# Patient Record
Sex: Female | Born: 1965
Health system: Southern US, Community
[De-identification: ages and names within clinical notes are randomized; demographics above are authoritative.]

## PROBLEM LIST (undated history)

## (undated) DIAGNOSIS — E785 Hyperlipidemia, unspecified: Secondary | ICD-10-CM

## (undated) DIAGNOSIS — B019 Varicella without complication: Secondary | ICD-10-CM

## (undated) DIAGNOSIS — G43909 Migraine, unspecified, not intractable, without status migrainosus: Secondary | ICD-10-CM

## (undated) DIAGNOSIS — N390 Urinary tract infection, site not specified: Secondary | ICD-10-CM

## (undated) DIAGNOSIS — R51 Headache: Secondary | ICD-10-CM

## (undated) DIAGNOSIS — E119 Type 2 diabetes mellitus without complications: Secondary | ICD-10-CM

## (undated) DIAGNOSIS — E0590 Thyrotoxicosis, unspecified without thyrotoxic crisis or storm: Secondary | ICD-10-CM

## (undated) DIAGNOSIS — Z87440 Personal history of urinary (tract) infections: Secondary | ICD-10-CM

## (undated) DIAGNOSIS — M1990 Unspecified osteoarthritis, unspecified site: Secondary | ICD-10-CM

## (undated) DIAGNOSIS — F418 Other specified anxiety disorders: Secondary | ICD-10-CM

## (undated) DIAGNOSIS — E663 Overweight: Secondary | ICD-10-CM

## (undated) DIAGNOSIS — R519 Headache, unspecified: Secondary | ICD-10-CM

## (undated) DIAGNOSIS — B269 Mumps without complication: Secondary | ICD-10-CM

## (undated) HISTORY — DX: Hyperlipidemia, unspecified: E78.5

## (undated) HISTORY — DX: Personal history of urinary (tract) infections: Z87.440

## (undated) HISTORY — DX: Urinary tract infection, site not specified: N39.0

## (undated) HISTORY — DX: Varicella without complication: B01.9

## (undated) HISTORY — DX: Mumps without complication: B26.9

## (undated) HISTORY — DX: Other specified anxiety disorders: F41.8

## (undated) HISTORY — DX: Headache, unspecified: R51.9

## (undated) HISTORY — DX: Overweight: E66.3

## (undated) HISTORY — DX: Headache: R51

## (undated) HISTORY — DX: Unspecified osteoarthritis, unspecified site: M19.90

## (undated) HISTORY — DX: Migraine, unspecified, not intractable, without status migrainosus: G43.909

## (undated) HISTORY — DX: Type 2 diabetes mellitus without complications: E11.9

---
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
## 1991-01-18 HISTORY — PX: TUBAL LIGATION: SHX77

## 2009-04-17 LAB — HM PAP SMEAR: HM Pap smear: NORMAL

## 2012-09-28 ENCOUNTER — Ambulatory Visit: Payer: Self-pay | Admitting: Family Medicine

## 2012-11-01 ENCOUNTER — Ambulatory Visit (INDEPENDENT_AMBULATORY_CARE_PROVIDER_SITE_OTHER): Payer: Self-pay | Admitting: Family Medicine

## 2012-11-01 ENCOUNTER — Encounter: Payer: Self-pay | Admitting: Family Medicine

## 2012-11-01 VITALS — BP 94/68 | HR 63 | Temp 97.6°F | Ht 69.5 in | Wt 194.0 lb

## 2012-11-01 DIAGNOSIS — F341 Dysthymic disorder: Secondary | ICD-10-CM

## 2012-11-01 DIAGNOSIS — F329 Major depressive disorder, single episode, unspecified: Secondary | ICD-10-CM

## 2012-11-01 DIAGNOSIS — G43909 Migraine, unspecified, not intractable, without status migrainosus: Secondary | ICD-10-CM

## 2012-11-01 DIAGNOSIS — F418 Other specified anxiety disorders: Secondary | ICD-10-CM

## 2012-11-01 DIAGNOSIS — Z87440 Personal history of urinary (tract) infections: Secondary | ICD-10-CM | POA: Insufficient documentation

## 2012-11-01 DIAGNOSIS — E663 Overweight: Secondary | ICD-10-CM

## 2012-11-01 DIAGNOSIS — F32A Depression, unspecified: Secondary | ICD-10-CM

## 2012-11-01 DIAGNOSIS — N390 Urinary tract infection, site not specified: Secondary | ICD-10-CM

## 2012-11-01 DIAGNOSIS — F3289 Other specified depressive episodes: Secondary | ICD-10-CM

## 2012-11-01 DIAGNOSIS — R519 Headache, unspecified: Secondary | ICD-10-CM | POA: Insufficient documentation

## 2012-11-01 DIAGNOSIS — E119 Type 2 diabetes mellitus without complications: Secondary | ICD-10-CM

## 2012-11-01 DIAGNOSIS — Z0000 Encounter for general adult medical examination without abnormal findings: Secondary | ICD-10-CM

## 2012-11-01 DIAGNOSIS — M1990 Unspecified osteoarthritis, unspecified site: Secondary | ICD-10-CM

## 2012-11-01 DIAGNOSIS — M129 Arthropathy, unspecified: Secondary | ICD-10-CM

## 2012-11-01 DIAGNOSIS — R51 Headache: Secondary | ICD-10-CM

## 2012-11-01 DIAGNOSIS — E785 Hyperlipidemia, unspecified: Secondary | ICD-10-CM

## 2012-11-01 LAB — CBC
HCT: 36.2 % (ref 36.0–46.0)
MCH: 26.7 pg (ref 26.0–34.0)
MCV: 82.6 fL (ref 78.0–100.0)
Platelets: 339 10*3/uL (ref 150–400)
RBC: 4.38 MIL/uL (ref 3.87–5.11)
RDW: 13.3 % (ref 11.5–15.5)

## 2012-11-01 MED ORDER — ALPRAZOLAM 0.25 MG PO TABS: 0.2500 mg | ORAL_TABLET | Freq: Two times a day (BID) | ORAL | Status: DC | PRN

## 2012-11-01 MED ORDER — SUMATRIPTAN SUCCINATE 50 MG PO TABS: 50.0000 mg | ORAL_TABLET | ORAL | Status: DC | PRN

## 2012-11-01 MED ORDER — ESCITALOPRAM OXALATE 10 MG PO TABS: ORAL_TABLET | ORAL | Status: DC

## 2012-11-01 NOTE — Patient Instructions (Signed)
Switch to krill oil MegaRed caps 1 daily Ibuprofen 200 mg tabs 2 tabs po early in HA is best  Preventive Care for Adults, Female A healthy lifestyle and preventive care can promote health and wellness. Preventive health guidelines for women include the following key practices.  A routine yearly physical is a good way to check with your caregiver about your health and preventive screening. It is a chance to share any concerns and updates on your health, and to receive a thorough exam.  Visit your dentist for a routine exam and preventive care every 6 months. Brush your teeth twice a day and floss once a day. Good oral hygiene prevents tooth decay and gum disease.  The frequency of eye exams is based on your age, health, family medical history, use of contact lenses, and other factors. Follow your caregiver's recommendations for frequency of eye exams.  Eat a healthy diet. Foods like vegetables, fruits, whole grains, low-fat dairy products, and lean protein foods contain the nutrients you need without too many calories. Decrease your intake of foods high in solid fats, added sugars, and salt. Eat the right amount of calories for you.Get information about a proper diet from your caregiver, if necessary.  Regular physical exercise is one of the most important things you can do for your health. Most adults should get at least 150 minutes of moderate-intensity exercise (any activity that increases your heart rate and causes you to sweat) each week. In addition, most adults need muscle-strengthening exercises on 2 or more days a week.  Maintain a healthy weight. The body mass index (BMI) is a screening tool to identify possible weight problems. It provides an estimate of body fat based on height and weight. Your caregiver can help determine your BMI, and can help you achieve or maintain a healthy weight.For adults 20 years and older:  A BMI below 18.5 is considered underweight.  A BMI of 18.5 to 24.9  is normal.  A BMI of 25 to 29.9 is considered overweight.  A BMI of 30 and above is considered obese.  Maintain normal blood lipids and cholesterol levels by exercising and minimizing your intake of saturated fat. Eat a balanced diet with plenty of fruit and vegetables. Blood tests for lipids and cholesterol should begin at age 28 and be repeated every 5 years. If your lipid or cholesterol levels are high, you are over 50, or you are at high risk for heart disease, you may need your cholesterol levels checked more frequently.Ongoing high lipid and cholesterol levels should be treated with medicines if diet and exercise are not effective.  If you smoke, find out from your caregiver how to quit. If you do not use tobacco, do not start.  If you are pregnant, do not drink alcohol. If you are breastfeeding, be very cautious about drinking alcohol. If you are not pregnant and choose to drink alcohol, do not exceed 1 drink per day. One drink is considered to be 12 ounces (355 mL) of beer, 5 ounces (148 mL) of wine, or 1.5 ounces (44 mL) of liquor.  Avoid use of street drugs. Do not share needles with anyone. Ask for help if you need support or instructions about stopping the use of drugs.  High blood pressure causes heart disease and increases the risk of stroke. Your blood pressure should be checked at least every 1 to 2 years. Ongoing high blood pressure should be treated with medicines if weight loss and exercise are not effective.  If you are 82 to 49 years old, ask your caregiver if you should take aspirin to prevent strokes.  Diabetes screening involves taking a blood sample to check your fasting blood sugar level. This should be done once every 3 years, after age 66, if you are within normal weight and without risk factors for diabetes. Testing should be considered at a younger age or be carried out more frequently if you are overweight and have at least 1 risk factor for diabetes.  Breast  cancer screening is essential preventive care for women. You should practice "breast self-awareness." This means understanding the normal appearance and feel of your breasts and may include breast self-examination. Any changes detected, no matter how small, should be reported to a caregiver. Women in their 59s and 30s should have a clinical breast exam (CBE) by a caregiver as part of a regular health exam every 1 to 3 years. After age 53, women should have a CBE every year. Starting at age 52, women should consider having a mammography (breast X-ray test) every year. Women who have a family history of breast cancer should talk to their caregiver about genetic screening. Women at a high risk of breast cancer should talk to their caregivers about having magnetic resonance imaging (MRI) and a mammography every year.  The Pap test is a screening test for cervical cancer. A Pap test can show cell changes on the cervix that might become cervical cancer if left untreated. A Pap test is a procedure in which cells are obtained and examined from the lower end of the uterus (cervix).  Women should have a Pap test starting at age 33.  Between ages 30 and 51, Pap tests should be repeated every 2 years.  Beginning at age 51, you should have a Pap test every 3 years as long as the past 3 Pap tests have been normal.  Some women have medical problems that increase the chance of getting cervical cancer. Talk to your caregiver about these problems. It is especially important to talk to your caregiver if a new problem develops soon after your last Pap test. In these cases, your caregiver may recommend more frequent screening and Pap tests.  The above recommendations are the same for women who have or have not gotten the vaccine for human papillomavirus (HPV).  If you had a hysterectomy for a problem that was not cancer or a condition that could lead to cancer, then you no longer need Pap tests. Even if you no longer need  a Pap test, a regular exam is a good idea to make sure no other problems are starting.  If you are between ages 69 and 15, and you have had normal Pap tests going back 10 years, you no longer need Pap tests. Even if you no longer need a Pap test, a regular exam is a good idea to make sure no other problems are starting.  If you have had past treatment for cervical cancer or a condition that could lead to cancer, you need Pap tests and screening for cancer for at least 20 years after your treatment.  If Pap tests have been discontinued, risk factors (such as a new sexual partner) need to be reassessed to determine if screening should be resumed.  The HPV test is an additional test that may be used for cervical cancer screening. The HPV test looks for the virus that can cause the cell changes on the cervix. The cells collected during the Pap test  can be tested for HPV. The HPV test could be used to screen women aged 89 years and older, and should be used in women of any age who have unclear Pap test results. After the age of 53, women should have HPV testing at the same frequency as a Pap test.  Colorectal cancer can be detected and often prevented. Most routine colorectal cancer screening begins at the age of 54 and continues through age 3. However, your caregiver may recommend screening at an earlier age if you have risk factors for colon cancer. On a yearly basis, your caregiver may provide home test kits to check for hidden blood in the stool. Use of a small camera at the end of a tube, to directly examine the colon (sigmoidoscopy or colonoscopy), can detect the earliest forms of colorectal cancer. Talk to your caregiver about this at age 12, when routine screening begins. Direct examination of the colon should be repeated every 5 to 10 years through age 26, unless early forms of pre-cancerous polyps or small growths are found.  Hepatitis C blood testing is recommended for all people born from [birth year]  through 1965 and any individual with known risks for hepatitis C.  Practice safe sex. Use condoms and avoid high-risk sexual practices to reduce the spread of sexually transmitted infections (STIs). STIs include gonorrhea, chlamydia, syphilis, trichomonas, herpes, HPV, and human immunodeficiency virus (HIV). Herpes, HIV, and HPV are viral illnesses that have no cure. They can result in disability, cancer, and death. Sexually active women aged 27 and younger should be checked for chlamydia. Older women with new or multiple partners should also be tested for chlamydia. Testing for other STIs is recommended if you are sexually active and at increased risk.  Osteoporosis is a disease in which the bones lose minerals and strength with aging. This can result in serious bone fractures. The risk of osteoporosis can be identified using a bone density scan. Women ages 17 and over and women at risk for fractures or osteoporosis should discuss screening with their caregivers. Ask your caregiver whether you should take a calcium supplement or vitamin D to reduce the rate of osteoporosis.  Menopause can be associated with physical symptoms and risks. Hormone replacement therapy is available to decrease symptoms and risks. You should talk to your caregiver about whether hormone replacement therapy is right for you.  Use sunscreen with sun protection factor (SPF) of 30 or more. Apply sunscreen liberally and repeatedly throughout the day. You should seek shade when your shadow is shorter than you. Protect yourself by wearing long sleeves, pants, a wide-brimmed hat, and sunglasses year round, whenever you are outdoors.  Once a month, do a whole body skin exam, using a mirror to look at the skin on your back. Notify your caregiver of new moles, moles that have irregular borders, moles that are larger than a pencil eraser, or moles that have changed in shape or color.  Stay current with required immunizations.  Influenza.  You need a dose every fall (or winter). The composition of the flu vaccine changes each year, so being vaccinated once is not enough.  Pneumococcal polysaccharide. You need 1 to 2 doses if you smoke cigarettes or if you have certain chronic medical conditions. You need 1 dose at age 65 (or older) if you have never been vaccinated.  Tetanus, diphtheria, pertussis (Tdap, Td). Get 1 dose of Tdap vaccine if you are younger than age 33, are over 81 and have contact with an infant,  are a Research scientist (physical sciences), are pregnant, or simply want to be protected from whooping cough. After that, you need a Td booster dose every 10 years. Consult your caregiver if you have not had at least 3 tetanus and diphtheria-containing shots sometime in your life or have a deep or dirty wound.  HPV. You need this vaccine if you are a woman age 50 or younger. The vaccine is given in 3 doses over 6 months.  Measles, mumps, rubella (MMR). You need at least 1 dose of MMR if you were born in 1957 or later. You may also need a second dose.  Meningococcal. If you are age 67 to 75 and a first-year college student living in a residence hall, or have one of several medical conditions, you need to get vaccinated against meningococcal disease. You may also need additional booster doses.  Zoster (shingles). If you are age 72 or older, you should get this vaccine.  Varicella (chickenpox). If you have never had chickenpox or you were vaccinated but received only 1 dose, talk to your caregiver to find out if you need this vaccine.  Hepatitis A. You need this vaccine if you have a specific risk factor for hepatitis A virus infection or you simply wish to be protected from this disease. The vaccine is usually given as 2 doses, 6 to 18 months apart.  Hepatitis B. You need this vaccine if you have a specific risk factor for hepatitis B virus infection or you simply wish to be protected from this disease. The vaccine is given in 3 doses, usually  over 6 months. Preventive Services / Frequency Ages 30 to 37  Blood pressure check.** / Every 1 to 2 years.  Lipid and cholesterol check.** / Every 5 years beginning at age 54.  Clinical breast exam.** / Every 3 years for women in their 61s and 30s.  Pap test.** / Every 2 years from ages 37 through 81. Every 3 years starting at age 4 through age 37 or 62 with a history of 3 consecutive normal Pap tests.  HPV screening.** / Every 3 years from ages 25 through ages 61 to 59 with a history of 3 consecutive normal Pap tests.  Hepatitis C blood test.** / For any individual with known risks for hepatitis C.  Skin self-exam. / Monthly.  Influenza immunization.** / Every year.  Pneumococcal polysaccharide immunization.** / 1 to 2 doses if you smoke cigarettes or if you have certain chronic medical conditions.  Tetanus, diphtheria, pertussis (Tdap, Td) immunization. / A one-time dose of Tdap vaccine. After that, you need a Td booster dose every 10 years.  HPV immunization. / 3 doses over 6 months, if you are 86 and younger.  Measles, mumps, rubella (MMR) immunization. / You need at least 1 dose of MMR if you were born in 1957 or later. You may also need a second dose.  Meningococcal immunization. / 1 dose if you are age 38 to 90 and a first-year college student living in a residence hall, or have one of several medical conditions, you need to get vaccinated against meningococcal disease. You may also need additional booster doses.  Varicella immunization.** / Consult your caregiver.  Hepatitis A immunization.** / Consult your caregiver. 2 doses, 6 to 18 months apart.  Hepatitis B immunization.** / Consult your caregiver. 3 doses usually over 6 months. Ages 46 to 23  Blood pressure check.** / Every 1 to 2 years.  Lipid and cholesterol check.** / Every 5 years beginning at  age 1.  Clinical breast exam.** / Every year after age 16.  Mammogram.** / Every year beginning at age 34 and  continuing for as long as you are in good health. Consult with your caregiver.  Pap test.** / Every 3 years starting at age 49 through age 2 or 81 with a history of 3 consecutive normal Pap tests.  HPV screening.** / Every 3 years from ages 31 through ages 69 to 65 with a history of 3 consecutive normal Pap tests.  Fecal occult blood test (FOBT) of stool. / Every year beginning at age 9 and continuing until age 70. You may not need to do this test if you get a colonoscopy every 10 years.  Flexible sigmoidoscopy or colonoscopy.** / Every 5 years for a flexible sigmoidoscopy or every 10 years for a colonoscopy beginning at age 49 and continuing until age 22.  Hepatitis C blood test.** / For all people born from [birth year] through 1967 and any individual with known risks for hepatitis C.  Skin self-exam. / Monthly.  Influenza immunization.** / Every year.  Pneumococcal polysaccharide immunization.** / 1 to 2 doses if you smoke cigarettes or if you have certain chronic medical conditions.  Tetanus, diphtheria, pertussis (Tdap, Td) immunization.** / A one-time dose of Tdap vaccine. After that, you need a Td booster dose every 10 years.  Measles, mumps, rubella (MMR) immunization. / You need at least 1 dose of MMR if you were born in 1957 or later. You may also need a second dose.  Varicella immunization.** / Consult your caregiver.  Meningococcal immunization.** / Consult your caregiver.  Hepatitis A immunization.** / Consult your caregiver. 2 doses, 6 to 18 months apart.  Hepatitis B immunization.** / Consult your caregiver. 3 doses, usually over 6 months. Ages 54 and over  Blood pressure check.** / Every 1 to 2 years.  Lipid and cholesterol check.** / Every 5 years beginning at age 34.  Clinical breast exam.** / Every year after age 32.  Mammogram.** / Every year beginning at age 43 and continuing for as long as you are in good health. Consult with your caregiver.  Pap test.** / Every  3 years starting at age 50 through age 35 or 76 with a 3 consecutive normal Pap tests. Testing can be stopped between 65 and 70 with 3 consecutive normal Pap tests and no abnormal Pap or HPV tests in the past 10 years.  HPV screening.** / Every 3 years from ages 16 through ages 31 or 74 with a history of 3 consecutive normal Pap tests. Testing can be stopped between 65 and 70 with 3 consecutive normal Pap tests and no abnormal Pap or HPV tests in the past 10 years.  Fecal occult blood test (FOBT) of stool. / Every year beginning at age 42 and continuing until age 10. You may not need to do this test if you get a colonoscopy every 10 years.  Flexible sigmoidoscopy or colonoscopy.** / Every 5 years for a flexible sigmoidoscopy or every 10 years for a colonoscopy beginning at age 88 and continuing until age 91.  Hepatitis C blood test.** / For all people born from [birth year] through 1965 and any individual with known risks for hepatitis C.  Osteoporosis screening.** / A one-time screening for women ages 55 and over and women at risk for fractures or osteoporosis.  Skin self-exam. / Monthly.  Influenza immunization.** / Every year.  Pneumococcal polysaccharide immunization.** / 1 dose at age 45 (or older) if you have  never been vaccinated.  Tetanus, diphtheria, pertussis (Tdap, Td) immunization. / A one-time dose of Tdap vaccine if you are over 65 and have contact with an infant, are a Research scientist (physical sciences), or simply want to be protected from whooping cough. After that, you need a Td booster dose every 10 years.  Varicella immunization.** / Consult your caregiver.  Meningococcal immunization.** / Consult your caregiver.  Hepatitis A immunization.** / Consult your caregiver. 2 doses, 6 to 18 months apart.  Hepatitis B immunization.** / Check with your caregiver. 3 doses, usually over 6 months. ** Family history and personal history of risk and conditions may change your caregiver's  recommendations. Document Released: 03/01/2001 Document Revised: 03/28/2011 Document Reviewed: 05/31/2010 [facility] Patient Information 2014 [place], [place].

## 2012-11-02 ENCOUNTER — Telehealth: Payer: Self-pay

## 2012-11-02 LAB — RENAL FUNCTION PANEL
Albumin: 4.1 g/dL (ref 3.5–5.2)
BUN: 15 mg/dL (ref 6–23)
CO2: 29 mEq/L (ref 19–32)
Chloride: 103 mEq/L (ref 96–112)
Creat: 0.8 mg/dL (ref 0.50–1.10)
Glucose, Bld: 182 mg/dL — ABNORMAL HIGH (ref 70–99)

## 2012-11-02 LAB — HEPATIC FUNCTION PANEL
AST: 14 U/L (ref 0–37)
Albumin: 4.1 g/dL (ref 3.5–5.2)
Alkaline Phosphatase: 73 U/L (ref 39–117)
Bilirubin, Direct: 0.1 mg/dL (ref 0.0–0.3)
Total Bilirubin: 0.4 mg/dL (ref 0.3–1.2)
Total Protein: 7.8 g/dL (ref 6.0–8.3)

## 2012-11-02 LAB — LIPID PANEL
Cholesterol: 230 mg/dL — ABNORMAL HIGH (ref 0–200)
LDL Cholesterol: 154 mg/dL — ABNORMAL HIGH (ref 0–99)
Total CHOL/HDL Ratio: 4.3 Ratio
VLDL: 22 mg/dL (ref 0–40)

## 2012-11-02 LAB — TSH: TSH: 1.168 u[IU]/mL (ref 0.350–4.500)

## 2012-11-02 NOTE — Telephone Encounter (Signed)
Notified pt's mother and she will relay message to pt.

## 2012-11-02 NOTE — Telephone Encounter (Signed)
So I gave her 3 months of Lexapro yesterday and 20 Alprazolam and 10 Imitrex she should not need anything else over the weekend

## 2012-11-02 NOTE — Telephone Encounter (Signed)
Pt states she was seen in the office yesterday and didn't want to get her refills at that time. She has changed her mind and decided she wants to pick them up Sunday. She is requesting refills on her Xanax, Lexapro, and Imitrex to be sent to [facility] in [place]. Please advise.

## 2012-11-03 ENCOUNTER — Encounter: Payer: Self-pay | Admitting: Family Medicine

## 2012-11-03 DIAGNOSIS — M1990 Unspecified osteoarthritis, unspecified site: Secondary | ICD-10-CM | POA: Insufficient documentation

## 2012-11-03 DIAGNOSIS — F418 Other specified anxiety disorders: Secondary | ICD-10-CM

## 2012-11-03 HISTORY — DX: Other specified anxiety disorders: F41.8

## 2012-11-03 NOTE — Assessment & Plan Note (Signed)
Encouraged HA hygiene and given rx for Imitrex may use Excedrine Migraine or Ibuprofen

## 2012-11-03 NOTE — Assessment & Plan Note (Signed)
Poor control, start on Metformin, avoid simple carbs and return for further counseling.

## 2012-11-03 NOTE — Assessment & Plan Note (Signed)
Started on Lexapro and Alprazolam prn

## 2012-11-03 NOTE — Progress Notes (Signed)
Patient ID: Sarah Ibarra, female   DOB: 31-Jul-1965, 47 y.o.   MRN: [id number] [facility] [id number] 24-Jun-1965 11/03/2012      Progress Note-Follow Up  Subjective  Chief Complaint  Chief Complaint  Patient presents with  . Establish Care    new patient    HPI  Patient is a 47 year old African American female who is in today to establish care. Struggling with perimenopausal hot flashes. They occur both day and night as frequently as every 1-2 hours. She's also having frequent headaches often with photophobia and some nausea. He's experiencing some polyuria and polydipsia and has been told she has diabetes in the past. No nausea vomiting. Tylenol and ibuprofen have been mildly helpful for headaches. Has chronic low back pain and hip pain as well. No chest pain, palpitations, shortness of breath.  Past Medical History  Diagnosis Date  . Chicken pox as a child  . Mumps as a child  . Frequent headaches   . Migraines   . UTI (urinary tract infection)   . Hyperlipidemia   . Diabetes mellitus without complication   . Arthritis     low back pain  . Overweight   . Hx: UTI (urinary tract infection)   . Depression with anxiety 11/03/2012    Past Surgical History  Procedure Laterality Date  . Tubal ligation  1993  . Cesarean section  1993    Family History  Problem Relation Age of Onset  . Other Mother     low blood pressure  . Hyperlipidemia Mother   . Multiple sclerosis Son   . Stroke Maternal Grandmother   . Diabetes Maternal Grandmother     type 2  . Alzheimer's disease Paternal Grandfather     History   Social History  . Marital Status: Single    Spouse Name: N/A    Number of Children: N/A  . Years of Education: N/A   Occupational History  . Not on file.   Social History Main Topics  . Smoking status: Never Smoker   . Smokeless tobacco: Never Used  . Alcohol Use: No  . Drug Use: No  . Sexual Activity: Yes    Partners: Male     Comment: lives with  mother   Other Topics Concern  . Not on file   Social History Narrative  . No narrative on file    No current outpatient prescriptions on file prior to visit.   No current facility-administered medications on file prior to visit.    Allergies  Allergen Reactions  . Morphine And Related     hives    Review of Systems  Review of Systems  Constitutional: Positive for malaise/fatigue. Negative for fever and chills.  HENT: Negative for congestion, hearing loss and nosebleeds.   Eyes: Negative for discharge.  Respiratory: Negative for cough, sputum production, shortness of breath and wheezing.   Cardiovascular: Negative for chest pain, palpitations and leg swelling.  Gastrointestinal: Negative for heartburn, nausea, vomiting, abdominal pain, diarrhea, constipation and blood in stool.  Genitourinary: Negative for dysuria, urgency, frequency and hematuria.  Musculoskeletal: Negative for back pain, falls and myalgias.  Skin: Negative for rash.  Neurological: Positive for headaches. Negative for dizziness, tremors, sensory change, focal weakness, loss of consciousness and weakness.  Endo/Heme/Allergies: Negative for polydipsia. Does not bruise/bleed easily.  Psychiatric/Behavioral: Positive for depression. Negative for suicidal ideas. The patient is nervous/anxious and has insomnia.     Objective  BP 94/68  Pulse 63  Temp(Src) 97.6 F (36.4  C) (Oral)  Ht 5' 9.5" (1.765 m)  Wt 194 lb 0.6 oz (88.016 kg)  BMI 28.25 kg/m2  SpO2 97%  LMP 04/06/2012  Physical Exam  Physical Exam  Constitutional: She is oriented to person, place, and time and well-developed, well-nourished, and in no distress. No distress.  HENT:  Head: Normocephalic and atraumatic.  Right Ear: External ear normal.  Left Ear: External ear normal.  Nose: Nose normal.  Mouth/Throat: Oropharynx is clear and moist. No oropharyngeal exudate.  Eyes: Conjunctivae are normal. Pupils are equal, round, and reactive to  light. Right eye exhibits no discharge. Left eye exhibits no discharge. No scleral icterus.  Neck: Normal range of motion. Neck supple. No thyromegaly present.  Cardiovascular: Normal rate, regular rhythm, normal heart sounds and intact distal pulses.   No murmur heard. Pulmonary/Chest: Effort normal and breath sounds normal. No respiratory distress. She has no wheezes. She has no rales.  Abdominal: Soft. Bowel sounds are normal. She exhibits no distension and no mass. There is no tenderness.  Musculoskeletal: Normal range of motion. She exhibits no edema and no tenderness.  Lymphadenopathy:    She has no cervical adenopathy.  Neurological: She is alert and oriented to person, place, and time. She has normal reflexes. No cranial nerve deficit. Coordination normal.  Skin: Skin is warm and dry. No rash noted. She is not diaphoretic.  Psychiatric: Mood, memory and affect normal.    Lab Results  Component Value Date   TSH 1.168 11/01/2012   Lab Results  Component Value Date   WBC 5.6 11/01/2012   HGB 11.7* 11/01/2012   HCT 36.2 11/01/2012   MCV 82.6 11/01/2012   PLT 339 11/01/2012   Lab Results  Component Value Date   CREATININE 0.80 11/01/2012   BUN 15 11/01/2012   NA 141 11/01/2012   K 4.5 11/01/2012   CL 103 11/01/2012   CO2 29 11/01/2012   Lab Results  Component Value Date   ALT 10 11/01/2012   AST 14 11/01/2012   ALKPHOS 73 11/01/2012   BILITOT 0.4 11/01/2012   Lab Results  Component Value Date   CHOL 230* 11/01/2012   Lab Results  Component Value Date   HDL 54 11/01/2012   Lab Results  Component Value Date   LDLCALC 154* 11/01/2012   Lab Results  Component Value Date   TRIG 111 11/01/2012   Lab Results  Component Value Date   CHOLHDL 4.3 11/01/2012     Assessment & Plan  Migraines Encouraged HA hygiene and given rx for Imitrex may use Excedrine Migraine or Ibuprofen  Diabetes mellitus without complication Poor control, start on Metformin, avoid  simple carbs and return for further counseling.   Hyperlipidemia Avoid trans fats, increase exercise. Will benefit from low dose statin.  Overweight Encouraged DASH diet and increased exercise  Depression with anxiety Started on Lexapro and Alprazolam prn

## 2012-11-03 NOTE — Assessment & Plan Note (Signed)
Avoid trans fats, increase exercise. Will benefit from low dose statin.

## 2012-11-03 NOTE — Assessment & Plan Note (Signed)
Encouraged DASH diet and increased exercise 

## 2012-11-19 ENCOUNTER — Telehealth: Payer: Self-pay | Admitting: Family Medicine

## 2012-11-19 NOTE — Telephone Encounter (Signed)
Received medical records from [facility]  P: [phone] F: [phone]

## 2012-11-21 ENCOUNTER — Other Ambulatory Visit: Payer: Self-pay

## 2012-11-21 ENCOUNTER — Telehealth: Payer: Self-pay

## 2012-11-21 MED ORDER — ATORVASTATIN CALCIUM 10 MG PO TABS: 10.0000 mg | ORAL_TABLET | Freq: Every day | ORAL | Status: DC

## 2012-11-21 MED ORDER — METFORMIN HCL 500 MG PO TABS: 500.0000 mg | ORAL_TABLET | Freq: Two times a day (BID) | ORAL | Status: DC

## 2012-11-21 NOTE — Telephone Encounter (Signed)
Message copied by Eulis Manly on Wed Nov 21, 2012  2:17 PM ------      Message from: Danise Edge A      Created: Fri Nov 02, 2012  7:50 AM       Notify hgba1c is 9.6 she needs to go on sugar meds and her cholesterol is up, she should start Metformin 500 mg po bid, disp #60, no rf and Atorvastatin 10 mg po qhs, dips #30 no rf and she should come back in in next couple weeks for discussion and to get glucometer, education and possibly referral to nutritionist ------

## 2012-11-21 NOTE — Telephone Encounter (Signed)
Called Patient and informed her that her metformin and Atorvastatin were ready for pick up.

## 2012-11-22 ENCOUNTER — Other Ambulatory Visit: Payer: Self-pay

## 2013-09-16 ENCOUNTER — Telehealth: Payer: Self-pay

## 2013-09-16 NOTE — Telephone Encounter (Signed)
LVM for pt to call back and schedule CPE with PCP.   Diabetic bundle pt.  

## 2013-10-24 LAB — HM DIABETES EYE EXAM

## 2013-11-19 ENCOUNTER — Encounter: Payer: Self-pay | Admitting: Family Medicine

## 2017-04-11 ENCOUNTER — Ambulatory Visit: Payer: Self-pay | Admitting: Family

## 2017-05-30 ENCOUNTER — Ambulatory Visit (INDEPENDENT_AMBULATORY_CARE_PROVIDER_SITE_OTHER): Payer: 59 | Admitting: Family

## 2017-05-30 ENCOUNTER — Encounter: Payer: Self-pay | Admitting: Family

## 2017-05-30 VITALS — BP 117/69 | HR 69 | Temp 98.3°F | Resp 16 | Ht 69.5 in | Wt 172.4 lb

## 2017-05-30 DIAGNOSIS — F418 Other specified anxiety disorders: Secondary | ICD-10-CM

## 2017-05-30 DIAGNOSIS — E1165 Type 2 diabetes mellitus with hyperglycemia: Secondary | ICD-10-CM | POA: Diagnosis not present

## 2017-05-30 DIAGNOSIS — E119 Type 2 diabetes mellitus without complications: Secondary | ICD-10-CM

## 2017-05-30 DIAGNOSIS — G43909 Migraine, unspecified, not intractable, without status migrainosus: Secondary | ICD-10-CM

## 2017-05-30 DIAGNOSIS — Z23 Encounter for immunization: Secondary | ICD-10-CM | POA: Diagnosis not present

## 2017-05-30 DIAGNOSIS — R5383 Other fatigue: Secondary | ICD-10-CM

## 2017-05-30 LAB — HEPATIC FUNCTION PANEL
ALT: 8 U/L (ref 0–35)
AST: 10 U/L (ref 0–37)
Albumin: 3.9 g/dL (ref 3.5–5.2)
Alkaline Phosphatase: 59 U/L (ref 39–117)
Bilirubin, Direct: 0.1 mg/dL (ref 0.0–0.3)
Total Bilirubin: 0.4 mg/dL (ref 0.2–1.2)
Total Protein: 7.5 g/dL (ref 6.0–8.3)

## 2017-05-30 LAB — CBC WITH DIFFERENTIAL/PLATELET
BASOS ABS: 0.2 10*3/uL — AB (ref 0.0–0.1)
BASOS PCT: 4.2 % — AB (ref 0.0–3.0)
Eosinophils Absolute: 0.1 10*3/uL (ref 0.0–0.7)
Eosinophils Relative: 2.5 % (ref 0.0–5.0)
HCT: 35.5 % — ABNORMAL LOW (ref 36.0–46.0)
Hemoglobin: 11.7 g/dL — ABNORMAL LOW (ref 12.0–15.0)
Lymphocytes Relative: 40.6 % (ref 12.0–46.0)
Lymphs Abs: 2.1 10*3/uL (ref 0.7–4.0)
MCHC: 32.9 g/dL (ref 30.0–36.0)
MCV: 84.7 fl (ref 78.0–100.0)
MONOS PCT: 11.3 % (ref 3.0–12.0)
Monocytes Absolute: 0.6 10*3/uL (ref 0.1–1.0)
NEUTROS ABS: 2.1 10*3/uL (ref 1.4–7.7)
NEUTROS PCT: 41.4 % — AB (ref 43.0–77.0)
PLATELETS: 322 10*3/uL (ref 150.0–400.0)
RBC: 4.2 Mil/uL (ref 3.87–5.11)
RDW: 12.9 % (ref 11.5–15.5)
WBC: 5.1 10*3/uL (ref 4.0–10.5)

## 2017-05-30 LAB — HEMOGLOBIN A1C: HEMOGLOBIN A1C: 8.3 % — AB (ref 4.6–6.5)

## 2017-05-30 LAB — BASIC METABOLIC PANEL
BUN: 17 mg/dL (ref 6–23)
CALCIUM: 9.5 mg/dL (ref 8.4–10.5)
CHLORIDE: 105 meq/L (ref 96–112)
CO2: 30 meq/L (ref 19–32)
Creatinine, Ser: 0.76 mg/dL (ref 0.40–1.20)
GFR: 101.93 mL/min (ref >60.00)
Glucose, Bld: 180 mg/dL — ABNORMAL HIGH (ref 70–99)
Potassium: 4.4 mEq/L (ref 3.5–5.1)
Sodium: 142 mEq/L (ref 135–145)

## 2017-05-30 LAB — LIPID PANEL
CHOLESTEROL: 209 mg/dL — AB (ref 0–200)
HDL: 57.5 mg/dL (ref >39.00)
LDL Cholesterol: 137 mg/dL — ABNORMAL HIGH (ref 0–99)
NonHDL: 151.65
TRIGLYCERIDES: 72 mg/dL (ref 0.0–149.0)
Total CHOL/HDL Ratio: 4
VLDL: 14.4 mg/dL (ref 0.0–40.0)

## 2017-05-30 LAB — TSH: TSH: 0.03 u[IU]/mL — ABNORMAL LOW (ref 0.35–4.50)

## 2017-05-30 NOTE — Assessment & Plan Note (Signed)
Currently stable. Monitor.  

## 2017-05-30 NOTE — Progress Notes (Signed)
Subjective:    Patient ID: Sarah Ibarra, female    DOB: 1965/04/27, 52 y.o.   MRN: [id number]  HPI  Patient is a 52 yr old female who presents today to establish care.  DM2- Reports that this was diagnosed in 2014.  Reports that her diet is a "roller coaster." Off meds since 2014. Now has insurance.   Lab Results  Component Value Date   HGBA1C 9.6 (H) 11/01/2012   Lab Results  Component Value Date   LDLCALC 154 (H) 11/01/2012   CREATININE 0.80 11/01/2012   Migraines- have improved with menopause.  Usually resolves with ibuprofen.    Depression/anxiety-Reports that "it hits hard sometimes."  Sleeps well.   Fatigue- c/o recent fatigue.    Review of Systems  Constitutional: Negative for unexpected weight change.  HENT: Negative for rhinorrhea.   Respiratory: Negative for cough.   Cardiovascular: Negative for leg swelling.  Gastrointestinal: Negative for constipation and diarrhea.  Genitourinary: Positive for frequency. Negative for dysuria.  Musculoskeletal: Negative for arthralgias and myalgias.  Skin: Negative for rash.  Hematological: Negative for adenopathy.  Psychiatric/Behavioral:       Recent breakup, grieving   Past Medical History:  Diagnosis Date  . Arthritis    low back pain  . Chicken pox as a child  . Depression with anxiety 11/03/2012  . Diabetes mellitus without complication (HCC)   . Frequent headaches   . Hx: UTI (urinary tract infection)   . Hyperlipidemia   . Migraines   . Mumps as a child  . Overweight   . UTI (urinary tract infection)      Social History   Socioeconomic History  . Marital status: Single    Spouse name: Not on file  . Number of children: 3  . Years of education: Not on file  . Highest education level: Not on file  Occupational History  . 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 Financial resource strain: Not hard at all  . Food insecurity:    Worry: Never true    Inability: Never true  .  Transportation needs:    Medical: No    Non-medical: No  Tobacco Use  . Smoking status: Never Smoker  . Smokeless tobacco: Never Used  Substance and Sexual Activity  . Alcohol use: No  . Drug use: No  . Sexual activity: Not Currently    Partners: Male    Comment: lives with mother  Lifestyle  . Physical activity:    Days per week: 3 days    Minutes per session: 60 min  . Stress: Not at all  Relationships  . Social connections:    Talks on phone: More than three times a week    Gets together: More than three times a week    Attends religious service: More than 4 times per year    Active member of club or organization: No    Attends meetings of clubs or organizations: Never    Relationship status: Not on file  . Intimate partner violence:    Fear of current or ex partner: No    Emotionally abused: No    Physically abused: No    Forced sexual activity: No  Other Topics Concern  . 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 CESAREAN SECTION  1993  . TUBAL LIGATION  1993    Family History  Problem Relation Age of Onset  . Other Mother        low blood pressure  . Hyperlipidemia Mother   . Multiple sclerosis Son   . Stroke Maternal Grandmother   . Diabetes Maternal Grandmother        type 2  . Alzheimer's disease Paternal Grandfather     Allergies  Allergen Reactions  . Morphine And Related     hives    No current outpatient medications on file prior to visit.   No current facility-administered medications on file prior to visit.     BP 117/69 (BP Location: Right Arm, Patient Position: Sitting, Cuff Size: Small)   Pulse 69   Temp 98.3 F (36.8 C) (Oral)   Resp 16   Ht 5' 9.5" (1.765 m)   Wt 172  lb 6.4 oz (78.2 kg)   SpO2 100%   BMI 25.09 kg/m        Objective:   Physical Exam  Constitutional: She is oriented to person, place, and time. She appears well-developed and well-nourished.  HENT:  Head: Normocephalic and atraumatic.  Eyes: Right eye exhibits no discharge. Left eye exhibits no discharge.  Neck: Neck supple. No thyromegaly present.  Cardiovascular: Normal rate, regular rhythm and normal heart sounds.  No murmur heard. Pulmonary/Chest: Effort normal and breath sounds normal. No respiratory distress. She has no wheezes.  Musculoskeletal: She exhibits no edema.  Lymphadenopathy:    She has no cervical adenopathy.  Neurological: She is alert and oriented to person, place, and time.  Skin: Skin is warm and dry.  Psychiatric: She has a normal mood and affect. Her behavior is normal. Judgment and thought content normal.          Assessment & Plan:  Fatigue- new. Check cbc and tsh to further evaluate.

## 2017-05-30 NOTE — Patient Instructions (Signed)
Please complete lab work prior to leaving.   

## 2017-05-30 NOTE — Assessment & Plan Note (Signed)
Stable/improved following menopause. Monitor.

## 2017-05-30 NOTE — Assessment & Plan Note (Signed)
Clinically uncontrolled. Obtain A1C, urine microalbumin. Further recommendations following review of results.  Pneumovax today.

## 2017-05-31 ENCOUNTER — Telehealth: Payer: Self-pay | Admitting: Family

## 2017-05-31 LAB — MICROALBUMIN / CREATININE URINE RATIO
CREATININE, U: 184.2 mg/dL
Microalb Creat Ratio: 0.5 mg/g (ref 0.0–30.0)
Microalb, Ur: 0.9 mg/dL (ref 0.0–1.9)

## 2017-05-31 MED ORDER — ATORVASTATIN CALCIUM 10 MG PO TABS: 10.0000 mg | ORAL_TABLET | Freq: Every day | ORAL | 3 refills | Status: DC

## 2017-05-31 MED ORDER — METFORMIN HCL 500 MG PO TABS: 500.0000 mg | ORAL_TABLET | Freq: Two times a day (BID) | ORAL | 3 refills | Status: DC

## 2017-05-31 NOTE — Telephone Encounter (Signed)
Lab work shows overactive thyroid.  Please ask lab to add on free t3 and free t4.  Also, sugar is elevated.  Begin metformin bid along with diabetic diet/exercise. Cholesterol above goal. Add atorvastatin  once daily. Mild anemia. Please ask lab to add on serum iron and ferritin dx anemia, and ask pt to complete IFOB.

## 2017-05-31 NOTE — Addendum Note (Signed)
Addended by: Sandford Craze on: 05/31/2017 10:19 AM   Modules accepted: Level of Service

## 2017-06-01 ENCOUNTER — Other Ambulatory Visit: Payer: Self-pay

## 2017-06-01 ENCOUNTER — Other Ambulatory Visit (INDEPENDENT_AMBULATORY_CARE_PROVIDER_SITE_OTHER): Payer: 59

## 2017-06-01 DIAGNOSIS — D649 Anemia, unspecified: Secondary | ICD-10-CM

## 2017-06-01 DIAGNOSIS — E0590 Thyrotoxicosis, unspecified without thyrotoxic crisis or storm: Secondary | ICD-10-CM

## 2017-06-01 LAB — T4, FREE: Free T4: 1.09 ng/dL (ref 0.60–1.60)

## 2017-06-01 LAB — FERRITIN: Ferritin: 74.4 ng/mL (ref 10.0–291.0)

## 2017-06-01 LAB — IRON: IRON: 61 ug/dL (ref 42–145)

## 2017-06-01 LAB — T3, FREE: T3, Free: 3.9 pg/mL (ref 2.3–4.2)

## 2017-06-01 NOTE — Telephone Encounter (Signed)
Results given to patient in detail, she understands results and will start medications. Form fax to [facility] to add test. IFOB will be mailed out to patient, future order entered.

## 2017-06-02 ENCOUNTER — Other Ambulatory Visit: Payer: Self-pay

## 2017-06-02 ENCOUNTER — Telehealth: Payer: Self-pay | Admitting: Family

## 2017-06-02 DIAGNOSIS — E0590 Thyrotoxicosis, unspecified without thyrotoxic crisis or storm: Secondary | ICD-10-CM

## 2017-06-02 NOTE — Telephone Encounter (Signed)
Patient advised of results, IFOB was mailed out to patient yesterday. Orders enter to repeat TSH, T3 and T4 in one month.

## 2017-06-02 NOTE — Telephone Encounter (Signed)
Iron level is low. One of her thyroid tests shows mild overactive thyroid. Please complete IFOB and repeat TSH, free T3/free T4 in f1 month.

## 2017-06-07 DIAGNOSIS — E119 Type 2 diabetes mellitus without complications: Secondary | ICD-10-CM | POA: Diagnosis not present

## 2017-06-07 DIAGNOSIS — H2513 Age-related nuclear cataract, bilateral: Secondary | ICD-10-CM | POA: Diagnosis not present

## 2017-06-07 LAB — HM DIABETES EYE EXAM

## 2017-06-09 ENCOUNTER — Other Ambulatory Visit (INDEPENDENT_AMBULATORY_CARE_PROVIDER_SITE_OTHER): Payer: 59

## 2017-06-09 DIAGNOSIS — D649 Anemia, unspecified: Secondary | ICD-10-CM | POA: Diagnosis not present

## 2017-06-09 LAB — FECAL OCCULT BLOOD, IMMUNOCHEMICAL: FECAL OCCULT BLD: NEGATIVE

## 2017-06-19 ENCOUNTER — Encounter: Payer: Self-pay | Admitting: Family

## 2018-09-07 LAB — HM DIABETES EYE EXAM

## 2018-10-05 ENCOUNTER — Ambulatory Visit (INDEPENDENT_AMBULATORY_CARE_PROVIDER_SITE_OTHER): Payer: 59 | Admitting: Family

## 2018-10-05 ENCOUNTER — Other Ambulatory Visit (HOSPITAL_COMMUNITY)
Admission: RE | Admit: 2018-10-05 | Discharge: 2018-10-05 | Disposition: A | Discharge status: Home / Self Care | Payer: 59 | Source: Ambulatory Visit | Attending: Family | Admitting: Family

## 2018-10-05 ENCOUNTER — Encounter: Payer: Self-pay | Admitting: Family

## 2018-10-05 ENCOUNTER — Other Ambulatory Visit: Payer: Self-pay

## 2018-10-05 DIAGNOSIS — Z0000 Encounter for general adult medical examination without abnormal findings: Secondary | ICD-10-CM

## 2018-10-05 DIAGNOSIS — E0590 Thyrotoxicosis, unspecified without thyrotoxic crisis or storm: Secondary | ICD-10-CM

## 2018-10-05 DIAGNOSIS — Z01419 Encounter for gynecological examination (general) (routine) without abnormal findings: Secondary | ICD-10-CM | POA: Diagnosis present

## 2018-10-05 DIAGNOSIS — Z1159 Encounter for screening for other viral diseases: Secondary | ICD-10-CM

## 2018-10-05 DIAGNOSIS — E785 Hyperlipidemia, unspecified: Secondary | ICD-10-CM

## 2018-10-05 DIAGNOSIS — D649 Anemia, unspecified: Secondary | ICD-10-CM

## 2018-10-05 DIAGNOSIS — E348 Other specified endocrine disorders: Secondary | ICD-10-CM

## 2018-10-05 DIAGNOSIS — E119 Type 2 diabetes mellitus without complications: Secondary | ICD-10-CM | POA: Diagnosis not present

## 2018-10-05 DIAGNOSIS — Z23 Encounter for immunization: Secondary | ICD-10-CM

## 2018-10-05 DIAGNOSIS — Z114 Encounter for screening for human immunodeficiency virus [HIV]: Secondary | ICD-10-CM

## 2018-10-05 LAB — CBC WITH DIFFERENTIAL/PLATELET
Basophils Absolute: 0 10*3/uL (ref 0.0–0.1)
Basophils Relative: 0.7 % (ref 0.0–3.0)
Eosinophils Absolute: 0.2 10*3/uL (ref 0.0–0.7)
Eosinophils Relative: 3 % (ref 0.0–5.0)
HCT: 34.3 % — ABNORMAL LOW (ref 36.0–46.0)
Hemoglobin: 11.1 g/dL — ABNORMAL LOW (ref 12.0–15.0)
Lymphocytes Relative: 43.5 % (ref 12.0–46.0)
Lymphs Abs: 2.4 10*3/uL (ref 0.7–4.0)
MCHC: 32.4 g/dL (ref 30.0–36.0)
MCV: 78.3 fl (ref 78.0–100.0)
Monocytes Absolute: 0.8 10*3/uL (ref 0.1–1.0)
Monocytes Relative: 15.2 % — ABNORMAL HIGH (ref 3.0–12.0)
Neutro Abs: 2.1 10*3/uL (ref 1.4–7.7)
Neutrophils Relative %: 37.6 % — ABNORMAL LOW (ref 43.0–77.0)
Platelets: 317 10*3/uL (ref 150.0–400.0)
RBC: 4.38 Mil/uL (ref 3.87–5.11)
RDW: 12.6 % (ref 11.5–15.5)
WBC: 5.5 10*3/uL (ref 4.0–10.5)

## 2018-10-05 LAB — COMPREHENSIVE METABOLIC PANEL
ALT: 15 U/L (ref 0–35)
AST: 13 U/L (ref 0–37)
Albumin: 3.8 g/dL (ref 3.5–5.2)
Alkaline Phosphatase: 74 U/L (ref 39–117)
BUN: 16 mg/dL (ref 6–23)
CO2: 28 mEq/L (ref 19–32)
Calcium: 10 mg/dL (ref 8.4–10.5)
Chloride: 100 mEq/L (ref 96–112)
Creatinine, Ser: 0.65 mg/dL (ref 0.40–1.20)
GFR: 114.29 mL/min (ref >60.00)
Glucose, Bld: 254 mg/dL — ABNORMAL HIGH (ref 70–99)
Potassium: 4.2 mEq/L (ref 3.5–5.1)
Sodium: 136 mEq/L (ref 135–145)
Total Bilirubin: 0.4 mg/dL (ref 0.2–1.2)
Total Protein: 7.2 g/dL (ref 6.0–8.3)

## 2018-10-05 LAB — LIPID PANEL
Cholesterol: 161 mg/dL (ref 0–200)
HDL: 39.4 mg/dL (ref >39.00)
LDL Cholesterol: 95 mg/dL (ref 0–99)
NonHDL: 122.06
Total CHOL/HDL Ratio: 4
Triglycerides: 136 mg/dL (ref 0.0–149.0)
VLDL: 27.2 mg/dL (ref 0.0–40.0)

## 2018-10-05 LAB — T3, FREE: T3, Free: 8.7 pg/mL — ABNORMAL HIGH (ref 2.3–4.2)

## 2018-10-05 LAB — HEMOGLOBIN A1C: Hgb A1c MFr Bld: 12.5 % — ABNORMAL HIGH (ref 4.6–6.5)

## 2018-10-05 LAB — T4, FREE: Free T4: 3.11 ng/dL — ABNORMAL HIGH (ref 0.60–1.60)

## 2018-10-05 LAB — TSH: TSH: <0.01 u[IU]/mL — ABNORMAL LOW (ref 0.35–4.50)

## 2018-10-05 NOTE — Patient Instructions (Signed)
Please complete lab work prior to leaving.   

## 2018-10-05 NOTE — Progress Notes (Signed)
Subjective:    Patient ID: Sarah Ibarra, female    DOB: Nov 30, 1965, 53 y.o.   MRN: [id number]  HPI  Patient is a 53 yr old female who presents today for cpx.  Immunizations: flu shot today, pneumovax and tdap up to date. Diet:healthy Exercise: she has lost 10 pounds since her last appointment, some exercise (walks on the track/push ups squats/arm exercise) Colonoscopy: due Dexa:  Due  Pap Smear: 2012 Mammogram: 2011 Dental: due Vision:  august  Anemia-  IFOB was negative. Iron and ferritin were normal.  DM2- last visit we began metformin and recommended diabetic diet and exercise.  Wt Readings from Last 3 Encounters:  10/05/18 162 lb (73.5 kg)  05/30/17 172 lb 6.4 oz (78.2 kg)  11/01/12 194 lb 0.6 oz (88 kg)    Lab Results  Component Value Date   HGBA1C 8.3 (H) 05/30/2017   HGBA1C 9.6 (H) 11/01/2012   Lab Results  Component Value Date   MICROALBUR 0.9 05/30/2017   LDLCALC 137 (H) 05/30/2017   CREATININE 0.76 05/30/2017   Hyperlipidemia- statin was added last visit.   Hyperthyroid-  Lab Results  Component Value Date   TSH 0.03 (L) 05/30/2017   Free t3 and free t4 were WNL.   Review of Systems  Constitutional: Negative for unexpected weight change.  HENT: Negative for hearing loss and rhinorrhea.   Eyes: Negative for visual disturbance.  Respiratory: Negative for cough and shortness of breath.   Cardiovascular: Negative for chest pain and leg swelling.  Gastrointestinal: Negative for constipation, diarrhea and nausea.  Genitourinary: Negative for dysuria, frequency and hematuria.  Musculoskeletal: Negative for arthralgias and myalgias.  Skin: Negative for rash.  Neurological: Negative for headaches.  Hematological: Negative for adenopathy.  Psychiatric/Behavioral:       Denies depression/anxiety   Past Medical History:  Diagnosis Date  . Arthritis    low back pain  . Chicken pox as a child  . Depression with anxiety 11/03/2012  . Diabetes mellitus  without complication (HCC)   . Frequent headaches   . Hx: UTI (urinary tract infection)   . Hyperlipidemia   . Migraines   . Mumps as a child  . Overweight   . UTI (urinary tract infection)      Social History   Socioeconomic History  . Marital status: Single    Spouse name: Not on file  . Number of children: 3  . Years of education: Not on file  . Highest education level: Not on file  Occupational History  . 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 Financial resource strain: Not hard at all  . Food insecurity    Worry: Never true    Inability: Never true  . Transportation needs    Medical: No    Non-medical: No  Tobacco Use  . Smoking status: Never Smoker  . Smokeless tobacco: Never Used  Substance and Sexual Activity  . Alcohol use: No  . Drug use: No  . Sexual activity: Not Currently    Partners: Male    Comment: lives with mother  Lifestyle  . Physical activity    Days per week: 3 days    Minutes per session: 60 min  . Stress: Not at all  Relationships  . Social connections    Talks on phone: More than three times a week    Gets together: More than three times a week    Attends religious service: More than 4 times per year  Active member of club or organization: No    Attends meetings of clubs or organizations: Never    Relationship status: Not on file  . Intimate partner violence    Fear of current or ex partner: No    Emotionally abused: No    Physically abused: No    Forced sexual activity: No  Other Topics Concern  . 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 CESAREAN SECTION  1993  . TUBAL LIGATION  1993    Family History  Problem Relation Age of Onset  . Other  Mother        low blood pressure  . Hyperlipidemia Mother   . Multiple sclerosis Son   . Stroke Maternal Grandmother   . Diabetes Maternal Grandmother        type 2  . Alzheimer's disease Paternal Grandfather     Allergies  Allergen Reactions  . Morphine And Related     hives    Current Outpatient Medications on File Prior to Visit  Medication Sig Dispense Refill  . atorvastatin (LIPITOR) 10 MG tablet Take 1 tablet (10 mg total) by mouth daily. (Patient not taking: Reported on 10/05/2018) 30 tablet 3  . metFORMIN (GLUCOPHAGE) 500 MG tablet Take 1 tablet (500 mg total) by mouth 2 (two) times daily with a meal. (Patient not taking: Reported on 10/05/2018) 60 tablet 3   No current facility-administered medications on file prior to visit.     BP (!) 113/57 (BP Location: Right Arm, Patient Position: Sitting, Cuff Size: Small)   Pulse 77   Temp (!) 97 F (36.1 C) (Temporal)   Resp 18   Ht 5\' 9"  (1.753 m)   Wt 162 lb (73.5 kg)   SpO2 100%   BMI 23.92 kg/m       Objective:   Physical Exam  Physical Exam  Constitutional: She is oriented to person, place, and time. She appears well-developed and well-nourished. No distress.  HENT:  Head: Normocephalic and atraumatic.  Right Ear: Tympanic membrane and ear canal normal.  Left Ear: Tympanic membrane and ear canal normal.  Mouth/Throat: Not examined, pt wearing mask for covid-19 precautions Eyes: Pupils are equal, round, and reactive to light. No scleral icterus.  Neck: Normal range of motion. No thyromegaly present.  Cardiovascular: Normal rate and regular rhythm.   No murmur heard. Pulmonary/Chest: Effort normal and breath sounds normal. No respiratory distress. He has no wheezes. She has no rales. She exhibits no tenderness.  Abdominal: Soft. Bowel sounds are normal. She exhibits no distension and no mass. There is no tenderness. There is no rebound and no guarding.  Musculoskeletal: She exhibits no edema.  Lymphadenopathy:     She has no cervical adenopathy.  Neurological: She is alert and oriented to person, place, and time. She has normal patellar reflexes. She exhibits normal muscle tone. Coordination normal.  Skin: Skin is warm and dry.  Psychiatric: She has a normal mood and affect. Her behavior is normal. Judgment and thought content normal.  Breasts: Examined lying Right: Without masses, retractions, discharge or axillary adenopathy.  Left: Without masses, retractions, discharge or axillary adenopathy.  Inguinal/mons: Normal without inguinal adenopathy  External genitalia:  Normal  BUS/Urethra/Skene's glands: Normal  Bladder: Normal  Vagina: Normal  Cervix: Normal  Uterus: normal in size, shape and contour. Midline and mobile  Adnexa/parametria:  Rt: Without masses or tenderness.  Lt: Without masses or tenderness.  Anus and perineum: Normal            Assessment & Plan:   Preventive care-patient is encouraged to continue healthy diet and regular exercise.  Will refer for mammogram and bone density.  She is also due for colonoscopy and referral has been placed.  Pap with high risk HPV testing was performed today. Tetanus and pneumovax up to date. We are out of shingrix today.  Diabetes type 2- tolerating metformin.  Has had substantial weight loss since her last visit.  Obtain follow-up A1c.  I expect it will look much better than last visit.  Hyperlipidemia- tolerating statin.  Will obtain follow-up lipid panel.  Anemia-had normal eye follow-up.  Iron within normal limits.  Will obtain follow-up CBC today.  Hypothyroid- depressed TSH noted last visit.  Will obtain follow-up thyroid function testing.      Assessment & Plan:

## 2018-10-08 ENCOUNTER — Encounter: Payer: Self-pay | Admitting: Family

## 2018-10-08 ENCOUNTER — Telehealth: Payer: Self-pay | Admitting: Family

## 2018-10-08 DIAGNOSIS — E0590 Thyrotoxicosis, unspecified without thyrotoxic crisis or storm: Secondary | ICD-10-CM

## 2018-10-08 DIAGNOSIS — E119 Type 2 diabetes mellitus without complications: Secondary | ICD-10-CM

## 2018-10-08 HISTORY — DX: Thyrotoxicosis, unspecified without thyrotoxic crisis or storm: E05.90

## 2018-10-08 LAB — HEPATITIS C ANTIBODY
Hepatitis C Ab: NONREACTIVE
SIGNAL TO CUT-OFF: 0.04 (ref <1.00)

## 2018-10-08 LAB — HIV ANTIBODY (ROUTINE TESTING W REFLEX): HIV 1&2 Ab, 4th Generation: NONREACTIVE

## 2018-10-08 MED ORDER — ATORVASTATIN CALCIUM 10 MG PO TABS: 10.0000 mg | ORAL_TABLET | Freq: Every day | ORAL | 3 refills | Status: DC

## 2018-10-08 MED ORDER — METFORMIN HCL 500 MG PO TABS: 500.0000 mg | ORAL_TABLET | Freq: Two times a day (BID) | ORAL | 3 refills | Status: DC

## 2018-10-08 NOTE — Telephone Encounter (Signed)
Please advise pt that her thyroid testing shows overactive thyroid and sugar is very uncontrolled.    Restart lipitor,metformin, and continue to work on diabetic diet, exercise.  I would like to refer her to endocrinology for help with her overactive thyroid and uncontrolled diabetes.

## 2018-10-08 NOTE — Telephone Encounter (Signed)
Results and provider's advised given to patient. Advised to pick up medication today and that someone will call her about appointment with endocrinology.

## 2018-10-09 ENCOUNTER — Telehealth: Payer: Self-pay | Admitting: Family

## 2018-10-09 LAB — CYTOLOGY - PAP
Diagnosis: NEGATIVE
High risk HPV: NEGATIVE
Molecular Disclaimer: 56
Molecular Disclaimer: DETECTED
Molecular Disclaimer: NORMAL

## 2018-10-09 MED ORDER — METRONIDAZOLE 0.75 % VA GEL: 1.0000 | Freq: Every day | VAGINAL | 0 refills | Status: AC

## 2018-10-09 NOTE — Telephone Encounter (Signed)
Please advise pt that her pap is negative but suggest bacteria vaginosis. Recommend rx with metrogel x 5 days.

## 2018-10-15 ENCOUNTER — Other Ambulatory Visit: Payer: Self-pay

## 2018-10-18 ENCOUNTER — Other Ambulatory Visit: Payer: Self-pay

## 2018-10-18 ENCOUNTER — Ambulatory Visit (INDEPENDENT_AMBULATORY_CARE_PROVIDER_SITE_OTHER): Payer: 59

## 2018-10-18 DIAGNOSIS — Z23 Encounter for immunization: Secondary | ICD-10-CM

## 2018-10-18 NOTE — Progress Notes (Signed)
Here for shingrix injection # 1 of 2

## 2018-11-05 ENCOUNTER — Other Ambulatory Visit: Payer: Self-pay

## 2018-11-05 ENCOUNTER — Ambulatory Visit (HOSPITAL_BASED_OUTPATIENT_CLINIC_OR_DEPARTMENT_OTHER)
Admission: RE | Admit: 2018-11-05 | Discharge: 2018-11-05 | Disposition: A | Discharge status: Home / Self Care | Payer: 59 | Source: Ambulatory Visit | Attending: Family | Admitting: Family

## 2018-11-05 DIAGNOSIS — E348 Other specified endocrine disorders: Secondary | ICD-10-CM | POA: Diagnosis present

## 2018-11-05 DIAGNOSIS — Z0000 Encounter for general adult medical examination without abnormal findings: Secondary | ICD-10-CM | POA: Insufficient documentation

## 2018-11-06 ENCOUNTER — Other Ambulatory Visit: Payer: Self-pay | Admitting: Family

## 2018-11-06 ENCOUNTER — Telehealth: Payer: Self-pay | Admitting: Family

## 2018-11-06 DIAGNOSIS — R928 Other abnormal and inconclusive findings on diagnostic imaging of breast: Secondary | ICD-10-CM

## 2018-11-06 NOTE — Telephone Encounter (Signed)
Please let pt know that the radiologist would like her to complete some additional breast images for further evaluation. Let me know if she has not been contacted by them about a follow up appointment in 1 week.   

## 2018-11-07 NOTE — Telephone Encounter (Signed)
Lvm  With patient's mother to call me back tomorrow

## 2018-11-08 ENCOUNTER — Other Ambulatory Visit: Payer: Self-pay

## 2018-11-08 ENCOUNTER — Ambulatory Visit
Admission: RE | Admit: 2018-11-08 | Discharge: 2018-11-08 | Disposition: A | Discharge status: Home / Self Care | Payer: 59 | Source: Ambulatory Visit | Attending: Family | Admitting: Family

## 2018-11-08 ENCOUNTER — Ambulatory Visit: Payer: 59

## 2018-11-08 DIAGNOSIS — R928 Other abnormal and inconclusive findings on diagnostic imaging of breast: Secondary | ICD-10-CM

## 2018-11-09 NOTE — Telephone Encounter (Signed)
Patient already had additional images.

## 2018-11-28 ENCOUNTER — Other Ambulatory Visit: Payer: Self-pay

## 2018-11-28 NOTE — Progress Notes (Deleted)
Name: Sarah Ibarra  MRN/ DOB: [id number], [birth year]   Age/ Sex: 53 y.o., female    PCP: Debbrah Alar, NP   Reason for Endocrinology Evaluation: Type {NUMBERS 1 OR 2:522190} Diabetes Mellitus     Date of Initial Endocrinology Visit: 11/28/2018     PATIENT IDENTIFIER: [name] is a 53 y.o. female with a past medical history of ***. The patient presented for initial endocrinology clinic visit on 11/28/2018 for consultative assistance with her diabetes management.    HPI: [name] was    Diagnosed with DM in 2014 Prior Medications tried/Intolerance: *** Currently checking blood sugars *** x / day,  before breakfast and ***.  Hypoglycemia episodes : ***               Symptoms: ***                 Frequency: ***/  Hemoglobin A1c has ranged from 8.3% in 2019, peaking at 12.5% in 2020. Patient required assistance for hypoglycemia:  Patient has required hospitalization within the last 1 year from hyper or hypoglycemia:   In terms of diet, the patient ***  HYPERTHYROID HISTORY: Pt was noted to have a suppressed TSH at 0.03uIU/mL in 05/2017 , repeat labs in 09/2018 confirmed hyperthyroidism with a TSH < 0.01 uIU/ml and elevated FT3 3.11 ng/dL   No history of osteoporosis   HOME DIABETES REGIMEN: Metformin 500 2 tabs     Statin: yes ACE-I/ARB: no Prior Diabetic Education: {Yes/No:11203}   METER DOWNLOAD SUMMARY: Date range evaluated: *** Fingerstick Blood Glucose Tests = *** Average Number Tests/Day = *** Overall Mean FS Glucose = *** Standard Deviation = ***  BG Ranges: Low = *** High = ***   Hypoglycemic Events/30 Days: BG < 50 = *** Episodes of symptomatic severe hypoglycemia = ***   DIABETIC COMPLICATIONS: Microvascular complications:   ***  Denies: CKD  Last eye exam: Completed   Macrovascular complications:   ***  Denies: CAD, PVD, CVA   PAST HISTORY: Past Medical History:  Past Medical History:  Diagnosis Date  . Arthritis     low back pain  . Chicken pox as a child  . Depression with anxiety 11/03/2012  . Diabetes mellitus without complication ([facility] [place])   . Frequent headaches   . Hx: UTI (urinary tract infection)   . Hyperlipidemia   . Hyperthyroidism 10/08/2018  . Migraines   . Mumps as a child  . Overweight   . UTI (urinary tract infection)     Past Surgical History:  Past Surgical History:  Procedure Laterality Date  . CESAREAN SECTION  1993  . TUBAL LIGATION  1993      Social History:  reports that she has never smoked. She has never used smokeless tobacco. She reports that she does not drink alcohol or use drugs. Family History:  Family History  Problem Relation Age of Onset  . Other Mother        low blood pressure  . Hyperlipidemia Mother   . Multiple sclerosis Son   . Stroke Maternal Grandmother   . Diabetes Maternal Grandmother        type 2  . Alzheimer's disease Paternal Grandfather       HOME MEDICATIONS: Allergies as of 11/30/2018      Reactions   Morphine And Related    hives      Medication List       Accurate as of November 28, 2018 11:17 AM. If you have any  questions, ask your nurse or doctor.        atorvastatin 10 MG tablet Commonly known as: LIPITOR Take 1 tablet (10 mg total) by mouth daily.   metFORMIN 500 MG tablet Commonly known as: GLUCOPHAGE Take 1 tablet (500 mg total) by mouth 2 (two) times daily with a meal.        ALLERGIES: Allergies  Allergen Reactions  . Morphine And Related     hives     REVIEW OF SYSTEMS: A comprehensive ROS was conducted with the patient and is negative except as per HPI and below:  ROS    OBJECTIVE:   VITAL SIGNS: There were no vitals taken for this visit.   PHYSICAL EXAM:  General: Pt appears well and is in NAD  Hydration: Well-hydrated with moist mucous membranes and good skin turgor  HEENT: Head: Unremarkable with good dentition. Oropharynx clear without exudate.  Eyes: External eye exam normal  without stare, lid lag or exophthalmos.  EOM intact.  PERRL.  Neck: General: Supple without adenopathy or carotid bruits. Thyroid: Thyroid size normal.  No goiter or nodules appreciated. No thyroid bruit.  Lungs: Clear with good BS bilat with no rales, rhonchi, or wheezes  Heart: RRR with normal S1 and S2 and no gallops; no murmurs; no rub  Abdomen: Normoactive bowel sounds, soft, nontender, without masses or organomegaly palpable  Extremities:  Lower extremities - No pretibial edema. No lesions.  Skin: Normal texture and temperature to palpation. No rash noted. No Acanthosis nigricans/skin tags. No lipohypertrophy.  Neuro: MS is good with appropriate affect, pt is alert and Ox3    DM foot exam:    DATA REVIEWED:  Lab Results  Component Value Date   HGBA1C 12.5 (H) 10/05/2018   HGBA1C 8.3 (H) 05/30/2017   HGBA1C 9.6 (H) 11/01/2012   Lab Results  Component Value Date   MICROALBUR 0.9 05/30/2017   LDLCALC 95 10/05/2018   CREATININE 0.65 10/05/2018   Lab Results  Component Value Date   MICRALBCREAT 0.5 05/30/2017    Lab Results  Component Value Date   CHOL 161 10/05/2018   HDL 39.40 10/05/2018   LDLCALC 95 10/05/2018   TRIG 136.0 10/05/2018   CHOLHDL 4 10/05/2018        ASSESSMENT / PLAN / RECOMMENDATIONS:   1) Type 2 Diabetes Mellitus, Poorly Controlled, With*** complications - Most recent A1c of *** %. Goal A1c < *** %.  ***  Plan: GENERAL:  ***  MEDICATIONS:  ***  EDUCATION / INSTRUCTIONS:  BG monitoring instructions: Patient is instructed to check her blood sugars *** times a day, ***.  Call [place] if: BG persistently < 70 or > 300. . I reviewed the Rule of 15 for the treatment of hypoglycemia in detail with the patient. Literature supplied.   2) Diabetic complications:   Eye: Does *** have known diabetic retinopathy.   Neuro/ Feet: Does *** have known diabetic peripheral neuropathy.  Renal: Patient does *** have known  baseline CKD. She is *** on an ACEI/ARB at present.Check urine albumin/creatinine ratio yearly starting at time of diagnosis. If albuminuria is positive, treatment is geared toward better glucose, blood pressure control and use of ACE inhibitors or ARBs. Monitor electrolytes and creatinine once to twice yearly.   3) Lipids: Patient is *** on a statin.    4) Hyperthyroidism:       Signed electronically by: Lyndle HerrlichAbby Jaralla , MD  [facility]  [facility] [address]  [address] [place], [place] [address] Phone: [phone] FAX: [phone]   CC: Sandford Craze, NP [address] [facility] [address] [place] [address] Phone: [phone]  Fax: [phone]    Return to Endocrinology clinic as below: Future Appointments  Date Time Provider Department Center  11/30/2018  8:30 AM , Konrad Dolores, MD [facility] None

## 2018-11-30 ENCOUNTER — Ambulatory Visit: Payer: 59 | Admitting: Internal Medicine

## 2018-12-10 ENCOUNTER — Other Ambulatory Visit: Payer: Self-pay

## 2018-12-12 ENCOUNTER — Other Ambulatory Visit: Payer: Self-pay

## 2018-12-12 ENCOUNTER — Encounter: Payer: Self-pay | Admitting: Internal Medicine

## 2018-12-12 ENCOUNTER — Ambulatory Visit (INDEPENDENT_AMBULATORY_CARE_PROVIDER_SITE_OTHER): Payer: 59 | Admitting: Internal Medicine

## 2018-12-12 ENCOUNTER — Other Ambulatory Visit: Payer: Self-pay | Admitting: Internal Medicine

## 2018-12-12 VITALS — BP 118/62 | HR 92 | Temp 98.3°F | Ht 69.0 in | Wt 156.4 lb

## 2018-12-12 DIAGNOSIS — E0590 Thyrotoxicosis, unspecified without thyrotoxic crisis or storm: Secondary | ICD-10-CM

## 2018-12-12 DIAGNOSIS — E119 Type 2 diabetes mellitus without complications: Secondary | ICD-10-CM | POA: Diagnosis not present

## 2018-12-12 DIAGNOSIS — E1165 Type 2 diabetes mellitus with hyperglycemia: Secondary | ICD-10-CM | POA: Insufficient documentation

## 2018-12-12 LAB — COMPREHENSIVE METABOLIC PANEL
ALT: 17 U/L (ref 0–35)
AST: 17 U/L (ref 0–37)
Albumin: 3.5 g/dL (ref 3.5–5.2)
Alkaline Phosphatase: 71 U/L (ref 39–117)
BUN: 17 mg/dL (ref 6–23)
CO2: 29 mEq/L (ref 19–32)
Calcium: 9.9 mg/dL (ref 8.4–10.5)
Chloride: 103 mEq/L (ref 96–112)
Creatinine, Ser: 0.57 mg/dL (ref 0.40–1.20)
GFR: 132.9 mL/min (ref >60.00)
Glucose, Bld: 235 mg/dL — ABNORMAL HIGH (ref 70–99)
Potassium: 4 mEq/L (ref 3.5–5.1)
Sodium: 138 mEq/L (ref 135–145)
Total Bilirubin: 0.4 mg/dL (ref 0.2–1.2)
Total Protein: 7.3 g/dL (ref 6.0–8.3)

## 2018-12-12 LAB — CBC WITH DIFFERENTIAL/PLATELET
Basophils Absolute: 0.1 10*3/uL (ref 0.0–0.1)
Basophils Relative: 0.9 % (ref 0.0–3.0)
Eosinophils Absolute: 0.2 10*3/uL (ref 0.0–0.7)
Eosinophils Relative: 3 % (ref 0.0–5.0)
HCT: 31.7 % — ABNORMAL LOW (ref 36.0–46.0)
Hemoglobin: 10.2 g/dL — ABNORMAL LOW (ref 12.0–15.0)
Lymphocytes Relative: 47.5 % — ABNORMAL HIGH (ref 12.0–46.0)
Lymphs Abs: 2.9 10*3/uL (ref 0.7–4.0)
MCHC: 32.3 g/dL (ref 30.0–36.0)
MCV: 77 fl — ABNORMAL LOW (ref 78.0–100.0)
Monocytes Absolute: 1.2 10*3/uL — ABNORMAL HIGH (ref 0.1–1.0)
Monocytes Relative: 19.5 % — ABNORMAL HIGH (ref 3.0–12.0)
Neutro Abs: 1.8 10*3/uL (ref 1.4–7.7)
Neutrophils Relative %: 29.1 % — ABNORMAL LOW (ref 43.0–77.0)
Platelets: 276 10*3/uL (ref 150.0–400.0)
RBC: 4.12 Mil/uL (ref 3.87–5.11)
RDW: 13.1 % (ref 11.5–15.5)
WBC: 6.1 10*3/uL (ref 4.0–10.5)

## 2018-12-12 LAB — T4, FREE: Free T4: 4.41 ng/dL — ABNORMAL HIGH (ref 0.60–1.60)

## 2018-12-12 LAB — GLUCOSE, POCT (MANUAL RESULT ENTRY): POC Glucose: 245 mg/dl — AB (ref 70–99)

## 2018-12-12 LAB — TSH: TSH: <0.01 u[IU]/mL — ABNORMAL LOW (ref 0.35–4.50)

## 2018-12-12 MED ORDER — GLIPIZIDE 10 MG PO TABS: 10.0000 mg | ORAL_TABLET | Freq: Two times a day (BID) | ORAL | 1 refills | Status: DC

## 2018-12-12 MED ORDER — METHIMAZOLE 10 MG PO TABS: 10.0000 mg | ORAL_TABLET | Freq: Two times a day (BID) | ORAL | 6 refills | Status: DC

## 2018-12-12 NOTE — Patient Instructions (Addendum)
-   Stop Metformin  - Start Glipizide 10 mg before Breakfast and Before supper   - We recommend that you follow these hyperthyroidism instructions at home:  1) Take Methimazole 10 mg , TWICE a day  If you develop severe sore throat with high fevers OR develop unexplained yellowing of your skin, eyes, under your tongue, severe abdominal pain with nausea or vomiting --> then please get evaluated immediately.    - Check sugar before Breakfast and Supper      HOW TO TREAT LOW BLOOD SUGARS (Blood sugar LESS THAN 70 MG/DL)  Please follow the RULE OF 15 for the treatment of hypoglycemia treatment (when your (blood sugars are less than 70 mg/dL)    STEP 1: Take 15 grams of carbohydrates when your blood sugar is low, which includes:   3-4 GLUCOSE TABS  OR  3-4 OZ OF JUICE OR REGULAR SODA OR  ONE TUBE OF GLUCOSE GEL     STEP 2: RECHECK blood sugar in 15 MINUTES STEP 3: If your blood sugar is still low at the 15 minute recheck --> then, go back to STEP 1 and treat AGAIN with another 15 grams of carbohydrates.

## 2018-12-12 NOTE — Progress Notes (Signed)
Name: Sarah Ibarra  MRN/ DOB: [id number], 07/21/1965   Age/ Sex: 53 y.o., female    PCP: Sandford Craze'Sullivan, Melissa, NP   Reason for Endocrinology Evaluation: Type 2 Diabetes Mellitus/ Hyperthytroidism     Date of Initial Endocrinology Visit: 12/12/2018     PATIENT IDENTIFIER: Sarah Ibarra is a 53 y.o. female with a past medical history of T2DM, and Dyslipidemia. The patient presented for initial endocrinology clinic visit on 12/12/2018 for consultative assistance with her diabetes management.    HPI: Sarah Ibarra was    Diagnosed with DM in 2009 Prior Medications tried/Intolerance: N/A  Currently checking blood sugars 0 x / day Hypoglycemia episodes : no Hemoglobin A1c has ranged from 8.3% in 2019, peaking at 12.5% in 2020. Patient required assistance for hypoglycemia: no Patient has required hospitalization within the last 1 year from hyper or hypoglycemia: no  In terms of diet, the patient has been avoiding sugar-sweetened beverages, eats 2 meals a day      THYROID HISTORY:  Pt has been noted to have suppressed TSH < 0.01 uIU/mL with elevated FT4 3.11 ng/dL during routine workup. She was noted to have weight loss at the time of her presentation but no other symptoms.   Mother with thyroid disease   HOME DIABETES REGIMEN: Metformin 500 mg BID - takes it once a day due to nausea    Statin: yes ACE-I/ARB: no Prior Diabetic Education: No   METER DOWNLOAD SUMMARY: Did not bring    DIABETIC COMPLICATIONS: Microvascular complications:    Denies: CKD, retinopathy, neuropathy  Last eye exam: Completed 2020  Macrovascular complications:    Denies: CAD, PVD, CVA   PAST HISTORY: Past Medical History:  Past Medical History:  Diagnosis Date  . Arthritis    low back pain  . Chicken pox as a child  . Depression with anxiety 11/03/2012  . Diabetes mellitus without complication ([facility])   . Frequent headaches   . Hx: UTI (urinary tract infection)   .  Hyperlipidemia   . Hyperthyroidism 10/08/2018  . Migraines   . Mumps as a child  . Overweight   . UTI (urinary tract infection)    Past Surgical History:  Past Surgical History:  Procedure Laterality Date  . CESAREAN SECTION  1993  . TUBAL LIGATION  1993      Social History:  reports that she has never smoked. She has never used smokeless tobacco. She reports that she does not drink alcohol or use drugs. Family History:  Family History  Problem Relation Age of Onset  . Other Mother        low blood pressure  . Hyperlipidemia Mother   . Multiple sclerosis Son   . Stroke Maternal Grandmother   . Diabetes Maternal Grandmother        type 2  . Alzheimer's disease Paternal Grandfather      HOME MEDICATIONS: Allergies as of 12/12/2018      Reactions   Morphine And Related    hives      Medication List       Accurate as of December 12, 2018 11:56 AM. If you have any questions, ask your nurse or doctor.        atorvastatin 10 MG tablet Commonly known as: LIPITOR Take 1 tablet (10 mg total) by mouth daily.   glipiZIDE 10 MG tablet Commonly known as: GLUCOTROL Take 1 tablet (10 mg total) by mouth 2 (two) times daily before a meal. Started by: Scarlette ShortsIbtehal J Oree Mirelez, MD  metFORMIN 500 MG tablet Commonly known as: GLUCOPHAGE Take 1 tablet (500 mg total) by mouth 2 (two) times daily with a meal.   methimazole 10 MG tablet Commonly known as: TAPAZOLE Take 1 tablet (10 mg total) by mouth 2 (two) times daily. Started by: Dorita Sciara, MD        ALLERGIES: Allergies  Allergen Reactions  . Morphine And Related     hives     REVIEW OF SYSTEMS: A comprehensive ROS was conducted with the patient and is negative except as per HPI and below:  Review of Systems  Constitutional: Positive for weight loss. Negative for fever.  HENT: Negative for congestion and sore throat.   Respiratory: Negative for cough and shortness of breath.   Cardiovascular: Negative  for chest pain and palpitations.  Gastrointestinal: Positive for diarrhea and nausea.  Skin: Negative.   Neurological: Negative for tingling and tremors.  Psychiatric/Behavioral: Negative for depression. The patient is not nervous/anxious.       OBJECTIVE:   VITAL SIGNS: BP 118/62 (BP Location: Right Arm, Patient Position: Sitting, Cuff Size: Large)   Pulse 92   Temp 98.3 F (36.8 C)   Ht 5\' 9"  (1.753 m)   Wt 156 lb 6.4 oz (70.9 kg)   SpO2 97%   BMI 23.10 kg/m    PHYSICAL EXAM:  General: Pt appears well and is in NAD  HEENT: Head: Unremarkable with good dentition. Oropharynx clear without exudate.  Eyes: External eye exam normal without stare, lid lag or exophthalmos.  EOM intact.  PERRL.  Neck: General: Supple without adenopathy or carotid bruits. Thyroid: Thyroid size normal.  No goiter or nodules appreciated. No thyroid bruit.  Lungs: Clear with good BS bilat with no rales, rhonchi, or wheezes  Heart: RRR with normal S1 and S2 and no gallops; no murmurs; no rub  Abdomen: Normoactive bowel sounds, soft, nontender, without masses or organomegaly palpable  Extremities:  Lower extremities - No pretibial edema. No lesions.  Skin: Normal texture and temperature to palpation.    Neuro: MS is good with appropriate affect, pt is alert and Ox3    DM foot exam: Deferred    DATA REVIEWED:  Lab Results  Component Value Date   HGBA1C 12.5 (H) 10/05/2018   HGBA1C 8.3 (H) 05/30/2017   HGBA1C 9.6 (H) 11/01/2012   Lab Results  Component Value Date   MICROALBUR 0.9 05/30/2017   [place] 95 10/05/2018   CREATININE 0.65 10/05/2018   Lab Results  Component Value Date   MICRALBCREAT 0.5 05/30/2017    Lab Results  Component Value Date   CHOL 161 10/05/2018   HDL 39.40 10/05/2018   LDLCALC 95 10/05/2018   TRIG 136.0 10/05/2018   CHOLHDL 4 10/05/2018        ASSESSMENT / PLAN / RECOMMENDATIONS:   1) Type 2 Diabetes Mellitus, Poorly controlled, Without complications - Most  recent A1c of 12.5 %. Goal A1c < 7.0 %.    Plan: GENERAL: I have discussed with the patient the pathophysiology of diabetes. We went over the natural progression of the disease. We talked about both insulin resistance and insulin deficiency. We stressed the importance of lifestyle changes including diet and exercise. I explained the complications associated with diabetes including retinopathy, nephropathy, neuropathy as well as increased risk of cardiovascular disease. We went over the benefit seen with glycemic control.    I explained to the patient that diabetic patients are at higher than normal risk for amputations.  I recommended  insulin as the safest and the quickest way to bring her glucose down, but due to her work schedule she is  uncomfortable injecting insulin at this time, we have decided to start with Glipizide and recheck in 6 weeks.   She was encouraged to check glucose 2x a day, we discussed the importance of glucose checks and having that availability  To me. We also discussed the importance of lifestyle changes in controlling diabetes.   Will stop the Metformin due to persistent nausea and diarrhea despite being on one tablet only.   Reviewed rule 15 for hypoglycemia , she was advised she most likely have hypoglycemic symptoms with BG's even in the 100's and she could have 2 sips of sugar-sweetened beverages and recheck in 15 minutes.   MEDICATIONS: - Stop Metformin  - Start Glipizide 10 mg before Breakfast and Before supper   EDUCATION / INSTRUCTIONS:  BG monitoring instructions: Patient is instructed to check her blood sugars 2 times a day, fasting and supper  Call [facility] if: BG persistently < 70 or > 300. . I reviewed the Rule of 15 for the treatment of hypoglycemia in detail with the patient. Literature supplied.   2) Diabetic complications:   Eye: Does not have known diabetic retinopathy.   Neuro/ Feet: Does not have known diabetic  peripheral neuropathy.  Renal: Patient does not have known baseline CKD. She is not on an ACEI/ARB at present.   3) Lipids: Patient is on atorvastatin 10 mg daily   4) Hyperthyroidism:   - She is clinically euthyroid at this time  - Differential include graves' disease, toxic thyroid nodule(s) or thyroiditis  - Will start Methimazole 10 mg BID while awaiting TFT's which will take longer to come back due to a holiday weekend starting tomorrow - We discussed with pt the benefits of methimazole in the Tx of hyperthyroidism, as well as the possible side effects/complications of anti-thyroid drug Tx (specifically detailing the rare, but serious side effect of agranulocytosis). She was informed of need for regular thyroid function monitoring while on methimazole to ensure appropriate dosage without over-treatment. As well, we discussed the possible side effects of methimazole including the chance of rash, the small chance of liver irritation/juandice and the <=1 in 300-400 chance of sudden onset agranulocytosis.  We discussed importance of going to ED promptly (and stopping methimazole) if shewere to develop significant fever with severe sore throat of other evidence of acute infection.      Medications  Methimazole 10 mg BID       F/U in 6 weeks       Signed electronically by: Lyndle Herrlich, MD  [facility]  [facility] [address] [place] [address] [place], [place] [address] Phone: [phone] FAX: [phone]   CC: Sandford Craze, NP [address] [facility] [address] [place] [address] Phone: [phone]  Fax: [phone]    Return to Endocrinology clinic as below: Future Appointments  Date Time Provider Department Center  01/24/2019  8:10 AM Dajsha Massaro, Konrad Dolores, MD [facility] None

## 2018-12-13 ENCOUNTER — Other Ambulatory Visit: Payer: Self-pay

## 2018-12-13 ENCOUNTER — Observation Stay (HOSPITAL_BASED_OUTPATIENT_CLINIC_OR_DEPARTMENT_OTHER)
Admission: EM | Admit: 2018-12-13 | Discharge: 2018-12-14 | Disposition: A | Discharge status: Home / Self Care | Payer: 59 | Attending: Family Medicine | Admitting: Family Medicine

## 2018-12-13 ENCOUNTER — Emergency Department (HOSPITAL_BASED_OUTPATIENT_CLINIC_OR_DEPARTMENT_OTHER): Payer: 59

## 2018-12-13 ENCOUNTER — Encounter (HOSPITAL_BASED_OUTPATIENT_CLINIC_OR_DEPARTMENT_OTHER): Payer: Self-pay | Admitting: Emergency Medicine

## 2018-12-13 DIAGNOSIS — Z7984 Long term (current) use of oral hypoglycemic drugs: Secondary | ICD-10-CM | POA: Insufficient documentation

## 2018-12-13 DIAGNOSIS — E1165 Type 2 diabetes mellitus with hyperglycemia: Secondary | ICD-10-CM | POA: Insufficient documentation

## 2018-12-13 DIAGNOSIS — I4891 Unspecified atrial fibrillation: Principal | ICD-10-CM | POA: Insufficient documentation

## 2018-12-13 DIAGNOSIS — E785 Hyperlipidemia, unspecified: Secondary | ICD-10-CM | POA: Diagnosis present

## 2018-12-13 DIAGNOSIS — E0590 Thyrotoxicosis, unspecified without thyrotoxic crisis or storm: Secondary | ICD-10-CM | POA: Diagnosis present

## 2018-12-13 DIAGNOSIS — Z885 Allergy status to narcotic agent status: Secondary | ICD-10-CM | POA: Diagnosis not present

## 2018-12-13 DIAGNOSIS — I081 Rheumatic disorders of both mitral and tricuspid valves: Secondary | ICD-10-CM | POA: Insufficient documentation

## 2018-12-13 DIAGNOSIS — Z20828 Contact with and (suspected) exposure to other viral communicable diseases: Secondary | ICD-10-CM | POA: Diagnosis not present

## 2018-12-13 DIAGNOSIS — D649 Anemia, unspecified: Secondary | ICD-10-CM | POA: Insufficient documentation

## 2018-12-13 DIAGNOSIS — M1990 Unspecified osteoarthritis, unspecified site: Secondary | ICD-10-CM | POA: Diagnosis not present

## 2018-12-13 DIAGNOSIS — E119 Type 2 diabetes mellitus without complications: Secondary | ICD-10-CM

## 2018-12-13 DIAGNOSIS — Z79899 Other long term (current) drug therapy: Secondary | ICD-10-CM | POA: Insufficient documentation

## 2018-12-13 HISTORY — PX: CARDIOVERSION: SHX1299

## 2018-12-13 LAB — SARS CORONAVIRUS 2 AG (30 MIN TAT): SARS Coronavirus 2 Ag: NEGATIVE

## 2018-12-13 LAB — BASIC METABOLIC PANEL
Anion gap: 9 (ref 5–15)
BUN: 19 mg/dL (ref 6–20)
CO2: 22 mmol/L (ref 22–32)
Calcium: 9.7 mg/dL (ref 8.9–10.3)
Chloride: 106 mmol/L (ref 98–111)
Creatinine, Ser: 0.63 mg/dL (ref 0.44–1.00)
GFR calc Af Amer: >60 mL/min (ref >60)
GFR calc non Af Amer: >60 mL/min (ref >60)
Glucose, Bld: 242 mg/dL — ABNORMAL HIGH (ref 70–99)
Potassium: 3.9 mmol/L (ref 3.5–5.1)
Sodium: 137 mmol/L (ref 135–145)

## 2018-12-13 LAB — CBC
HCT: 33.1 % — ABNORMAL LOW (ref 36.0–46.0)
Hemoglobin: 10.3 g/dL — ABNORMAL LOW (ref 12.0–15.0)
MCH: 24.6 pg — ABNORMAL LOW (ref 26.0–34.0)
MCHC: 31.1 g/dL (ref 30.0–36.0)
MCV: 79 fL — ABNORMAL LOW (ref 80.0–100.0)
Platelets: 276 10*3/uL (ref 150–400)
RBC: 4.19 MIL/uL (ref 3.87–5.11)
RDW: 12.1 % (ref 11.5–15.5)
WBC: 5.4 10*3/uL (ref 4.0–10.5)
nRBC: 0 % (ref 0.0–0.2)

## 2018-12-13 LAB — SARS CORONAVIRUS 2 (TAT 6-24 HRS): SARS Coronavirus 2: NEGATIVE

## 2018-12-13 LAB — CBG MONITORING, ED: Glucose-Capillary: 228 mg/dL — ABNORMAL HIGH (ref 70–99)

## 2018-12-13 LAB — GLUCOSE, CAPILLARY
Glucose-Capillary: 201 mg/dL — ABNORMAL HIGH (ref 70–99)
Glucose-Capillary: 174 mg/dL — ABNORMAL HIGH (ref 70–99)

## 2018-12-13 LAB — MAGNESIUM: Magnesium: 1.7 mg/dL (ref 1.7–2.4)

## 2018-12-13 MED ORDER — SODIUM CHLORIDE 0.9 % IV SOLN
INTRAVENOUS | Status: DC | PRN
  Administered 2018-12-13: 1000 mL via INTRAVENOUS

## 2018-12-13 MED ORDER — INSULIN ASPART 100 UNIT/ML ~~LOC~~ SOLN
0.0000 [IU] | Freq: Three times a day (TID) | SUBCUTANEOUS | Status: DC
  Administered 2018-12-14: 2 [IU] via SUBCUTANEOUS

## 2018-12-13 MED ORDER — MAGNESIUM SULFATE 2 GM/50ML IV SOLN
2.0000 g | Freq: Once | INTRAVENOUS | Status: AC
  Administered 2018-12-13: 2 g via INTRAVENOUS
  Filled 2018-12-13: qty 50

## 2018-12-13 MED ORDER — APIXABAN 2.5 MG PO TABS
5.0000 mg | ORAL_TABLET | Freq: Once | ORAL | Status: AC
  Administered 2018-12-13: 5 mg via ORAL
  Filled 2018-12-13: qty 2

## 2018-12-13 MED ORDER — ACETAMINOPHEN 325 MG PO TABS: 650.0000 mg | ORAL_TABLET | Freq: Four times a day (QID) | ORAL | Status: DC | PRN

## 2018-12-13 MED ORDER — SODIUM CHLORIDE 0.9% FLUSH
3.0000 mL | Freq: Two times a day (BID) | INTRAVENOUS | Status: DC
  Administered 2018-12-13: 3 mL via INTRAVENOUS

## 2018-12-13 MED ORDER — ACETAMINOPHEN 650 MG RE SUPP: 650.0000 mg | Freq: Four times a day (QID) | RECTAL | Status: DC | PRN

## 2018-12-13 MED ORDER — ONDANSETRON HCL 4 MG/2ML IJ SOLN: 4.0000 mg | Freq: Four times a day (QID) | INTRAMUSCULAR | Status: DC | PRN

## 2018-12-13 MED ORDER — PROPOFOL 10 MG/ML IV BOLUS
0.5000 mg/kg | Freq: Once | INTRAVENOUS | Status: AC
  Administered 2018-12-13: 35.6 mg via INTRAVENOUS
  Filled 2018-12-13: qty 20

## 2018-12-13 MED ORDER — ATORVASTATIN CALCIUM 10 MG PO TABS
10.0000 mg | ORAL_TABLET | Freq: Every day | ORAL | Status: DC
  Administered 2018-12-13: 10 mg via ORAL
  Filled 2018-12-13: qty 1

## 2018-12-13 MED ORDER — METHIMAZOLE 10 MG PO TABS
10.0000 mg | ORAL_TABLET | Freq: Two times a day (BID) | ORAL | Status: DC
  Administered 2018-12-13: 10 mg via ORAL
  Filled 2018-12-13 (×3): qty 1

## 2018-12-13 MED ORDER — METOPROLOL TARTRATE 25 MG PO TABS
25.0000 mg | ORAL_TABLET | Freq: Two times a day (BID) | ORAL | Status: DC
  Administered 2018-12-13: 25 mg via ORAL
  Filled 2018-12-13: qty 1

## 2018-12-13 MED ORDER — DILTIAZEM HCL 100 MG IV SOLR
5.0000 mg/h | INTRAVENOUS | Status: DC
  Administered 2018-12-13: 5 mg/h via INTRAVENOUS
  Filled 2018-12-13 (×4): qty 100

## 2018-12-13 MED ORDER — ONDANSETRON HCL 4 MG PO TABS: 4.0000 mg | ORAL_TABLET | Freq: Four times a day (QID) | ORAL | Status: DC | PRN

## 2018-12-13 MED ORDER — DILTIAZEM LOAD VIA INFUSION
15.0000 mg | Freq: Once | INTRAVENOUS | Status: AC
  Administered 2018-12-13: 15 mg via INTRAVENOUS
  Filled 2018-12-13: qty 15

## 2018-12-13 MED ORDER — DILTIAZEM HCL-DEXTROSE 125-5 MG/125ML-% IV SOLN (PREMIX)
5.0000 mg/h | INTRAVENOUS | Status: DC
  Filled 2018-12-13: qty 125

## 2018-12-13 MED ORDER — APIXABAN 5 MG PO TABS
5.0000 mg | ORAL_TABLET | Freq: Two times a day (BID) | ORAL | Status: DC
  Administered 2018-12-13: 5 mg via ORAL
  Filled 2018-12-13 (×2): qty 1

## 2018-12-13 MED ORDER — DILTIAZEM HCL 100 MG IV SOLR
INTRAVENOUS | Status: AC
  Filled 2018-12-13: qty 100

## 2018-12-13 NOTE — Progress Notes (Addendum)
Patient discussed with ER staff, presents with new onset afib. Sudden clear onset of symptoms today (within 48 hrs) as documented by ER staff and thus ER cardioversion is appropriate. I have recommended started eliquis 5mg  bid to be started in ER, would need at least 1 month after conversion however with her CHADS2Vasc score of at least 2 would continue therapy indefintiely. After conversion would start lopressor 25mg  bid.Call us back if any issues. We will arrange a cardiology outpatient appointment. Unclear if hyperthyroid playing a role, may warrante an event monitor once thyroid is controlled to see if ongoing afib episodes.     Carlyle Dolly MD  Addendum 250 pm Discussed again with ER staff. Succesful cardioversion initially however shortly reverted back to afib. With her N/V and hyperthyroid I have asked she be admitted to a medicine team at [facility], would place on dilt gtt for rate control and continue eliquis. We will see when she arrives to [facility]   Carlyle Dolly MD

## 2018-12-13 NOTE — Sedation Documentation (Signed)
Pt c/o pain 6/10 to head.

## 2018-12-13 NOTE — ED Notes (Signed)
[facility] arrived to transfer pt to [facility] 

## 2018-12-13 NOTE — H&P (Signed)
History and Physical    Sarah Ibarra JYN:[id number] DOB: [birth year] DOA: 12/13/2018  PCP: Sandford Craze'Sullivan, Melissa, NP  Patient coming from: [facility]  I have personally briefly reviewed patient's old medical [facility]  Chief Complaint: Palpitations  HPI: Sarah Ibarra is a 53 y.o. female with medical history significant for type 2 diabetes, hyperlipidemia, depression with anxiety, and recently diagnosed hyperthyroidism who presented [facility] for evaluation of palpitations.  Patient had lab work performed 2 months ago by PCP indicative of hypothyroidism.  She was seen by her endocrinologist yesterday 12/12/2018 and was planned to start methimazole 10 mg twice daily which she has not yet started.  Repeat labs showed TSH <0.01, free T4 4.41.  Patient states she was in her usual state of health until earlier this morning while she was cooking when she developed new onset of significant palpitations.  She has associated nausea with vomiting.  She denied having any associated chest pain, dyspnea, fevers, chills, diaphoresis, diarrhea, edema, or swelling at her neck.  She denies any similar episode in the past.  Her only other medications are Metformin which is being changed to glipizide by her endocrinologist and atorvastatin.  ED Course:  Initial vitals showed BP 133/56, pulse 103, RR 18, temp 90.8 Fahrenheit, SPO2 100% on room air.  Labs were notable for sodium 137, potassium 3.9, chloride 106, bicarb 22, BUN 19, creatinine 0.63, serum glucose 242, magnesium 1.7, WBC 5.4, hemoglobin 10.3, platelets 276,000.  SARS-CoV-2 rapid antigen test was negative.  SARS-CoV-2 PCR test was collected and pending.  Portable chest x-ray was negative for acute cardiopulmonary process.  Initial EKG showed atrial fibrillation with rate 142, no prior for comparison.  EDP discussed the case with on-call cardiology who agreed with cardioversion.  Patient was  cardioverted in the ED with transient sinus rhythm which returned into atrial fibrillation after approximately 1 minute.  EP note.  EDP again discussed with cardiology who recommended admission to [facility], continuing IV diltiazem, and continuing Eliquis.  The hospitalist service was consulted to admit for further management and evaluation.  Review of Systems: All systems reviewed and are negative except as documented in history of present illness above.   Past Medical History:  Diagnosis Date  . Arthritis    low back pain  . Chicken pox as a child  . Depression with anxiety 11/03/2012  . Diabetes mellitus without complication (HCC)   . Frequent headaches   . Hx: UTI (urinary tract infection)   . Hyperlipidemia   . Hyperthyroidism 10/08/2018  . Migraines   . Mumps as a child  . Overweight   . UTI (urinary tract infection)     Past Surgical History:  Procedure Laterality Date  . CESAREAN SECTION  1993  . TUBAL LIGATION  1993    Social History:  reports that she has never smoked. She has never used smokeless tobacco. She reports that she does not drink alcohol or use drugs.  Allergies  Allergen Reactions  . Morphine And Related     hives    Family History  Problem Relation Age of Onset  . Other Mother        low blood pressure  . Hyperlipidemia Mother   . Multiple sclerosis Son   . Stroke Maternal Grandmother   . Diabetes Maternal Grandmother        type 2  . Alzheimer's disease Paternal Grandfather      Prior to Admission medications  Medication Sig Start Date End Date Taking? Authorizing Provider  atorvastatin (LIPITOR) 10 MG tablet Take 1 tablet (10 mg total) by mouth daily. 10/08/18   Sandford Craze, NP  glipiZIDE (GLUCOTROL) 10 MG tablet Take 1 tablet (10 mg total) by mouth 2 (two) times daily before a meal. 12/12/18   Shamleffer, Konrad Dolores, MD  metFORMIN (GLUCOPHAGE) 500 MG tablet Take 1 tablet (500 mg total) by mouth 2 (two) times daily  with a meal. 10/08/18   Sandford Craze, NP  methimazole (TAPAZOLE) 10 MG tablet Take 1 tablet (10 mg total) by mouth 2 (two) times daily. 12/12/18   Shamleffer, Konrad Dolores, MD    Physical Exam: Vitals:   12/13/18 1725 12/13/18 1730 12/13/18 1830 12/13/18 2008  BP: 111/65 118/76 114/66 (!) 113/51  Pulse: 100 92 89 100  Resp: 19 20  16   Temp:   98.7 F (37.1 C) 97.9 F (36.6 C)  TempSrc:   Oral Oral  SpO2: 100% 100% 100% 99%  Weight:   71.2 kg   Height:   5\' 9"  (1.753 m)     Constitutional: Resting supine in bed, NAD, calm, comfortable Eyes: PERRL, lids and conjunctivae normal, no exophthalmos ENMT: Mucous membranes are moist. Posterior pharynx clear of any exudate or lesions.Normal dentition.   Neck: normal, supple, no masses. No thyromegaly or thyroid nodules appreciated. Respiratory: clear to auscultation bilaterally, no wheezing, no crackles. Normal respiratory effort. No accessory muscle use.  Cardiovascular: Regular rate and rhythm, systolic murmur heard at left lower sternal border.. No extremity edema. 2+ pedal pulses. Abdomen: no tenderness, no masses palpated. No hepatosplenomegaly. Bowel sounds positive.  Musculoskeletal: no clubbing / cyanosis. No joint deformity upper and lower extremities. Good ROM, no contractures. Normal muscle tone.  Skin: no rashes, lesions, ulcers. No induration Neurologic: CN 2-12 grossly intact. Sensation intact, Strength 5/5 in all 4.  Psychiatric: Normal judgment and insight. Alert and oriented x 3. Normal mood.   Labs on Admission: I have personally reviewed following labs and imaging studies  CBC: Recent Labs  Lab 12/12/18 0938 12/13/18 1314  WBC 6.1 5.4  NEUTROABS 1.8  --   HGB 10.2* 10.3*  HCT 31.7* 33.1*  MCV 77.0* 79.0*  PLT 276.0 276   Basic Metabolic Panel: Recent Labs  Lab 12/12/18 0938 12/13/18 1314  NA 138 137  K 4.0 3.9  CL 103 106  CO2 29 22  GLUCOSE 235* 242*  BUN 17 19  CREATININE 0.57 0.63  CALCIUM  9.9 9.7  MG  --  1.7   GFR: Estimated Creatinine Clearance: 83 mL/min (by C-G formula based on SCr of 0.63 mg/dL). Liver Function Tests: Recent Labs  Lab 12/12/18 0938  AST 17  ALT 17  ALKPHOS 71  BILITOT 0.4  PROT 7.3  ALBUMIN 3.5   No results for input(s): LIPASE, AMYLASE in the last 168 hours. No results for input(s): AMMONIA in the last 168 hours. Coagulation Profile: No results for input(s): INR, PROTIME in the last 168 hours. Cardiac Enzymes: No results for input(s): CKTOTAL, CKMB, CKMBINDEX, TROPONINI in the last 168 hours. BNP (last 3 results) No results for input(s): PROBNP in the last 8760 hours. HbA1C: No results for input(s): HGBA1C in the last 72 hours. CBG: Recent Labs  Lab 12/13/18 1303 12/13/18 1841  GLUCAP 228* 201*   Lipid Profile: No results for input(s): CHOL, HDL, LDLCALC, TRIG, CHOLHDL, LDLDIRECT in the last 72 hours. Thyroid Function Tests: Recent Labs    12/12/18 0938  TSH <0.01*  FREET4 4.41*   Anemia Panel: No results for input(s): VITAMINB12, FOLATE, FERRITIN, TIBC, IRON, RETICCTPCT in the last 72 hours. Urine analysis: No results found for: COLORURINE, APPEARANCEUR, LABSPEC, [place], GLUCOSEU, HGBUR, BILIRUBINUR, KETONESUR, PROTEINUR, UROBILINOGEN, NITRITE, LEUKOCYTESUR  Radiological Exams on Admission: Dg Chest Port 1 View  Result Date: 12/13/2018 CLINICAL DATA:  Vomiting and headache today. Recent change in her diabetes medicine. EXAM: PORTABLE CHEST 1 VIEW COMPARISON:  None. FINDINGS: Cardiac silhouette is normal in size. No mediastinal or hilar masses. No evidence of adenopathy. Clear lungs.  No pleural effusion or pneumothorax. Skeletal structures unremarkable. IMPRESSION: No active disease. Electronically Signed   By: Lajean Manes M.D.   On: 12/13/2018 13:24    EKG: Independently reviewed. Initial EKG showed atrial fibrillation with rate 142, no prior for comparison.  Repeat EKG shows sinus tachycardia, rate 107.   Assessment/Plan Principal Problem:   Atrial fibrillation with RVR (HCC) Active Problems:   Hyperlipidemia   Diabetes mellitus without complication ([place])   Hyperthyroidism  Sarah Ibarra is a 53 y.o. female with medical history significant for type 2 diabetes, hyperlipidemia, depression with anxiety, and recently diagnosed hyperthyroidism who is admitted with new onset atrial fibrillation with RVR.  New onset atrial fibrillation with RVR: Beginning a.m. of 12/13/2018 in the setting of hyperthyroidism.  Cardioverted in the ED however reverted to atrial fibrillation after approximately 1 minute.  She was started on Eliquis orally. Her CHA2DS2-VASc Score is 2.  She was started on IV diltiazem and appears to have converted to normal sinus rhythm on arrival here. -On IV diltiazem, will transition to oral metoprolol 25 mg twice daily -Continue Eliquis 5 mg twice daily -Start methimazole for management of hyperthyroidism -Obtain echocardiogram -Replete magnesium -Cardiology consulted, appreciate further recommendations  Hyperthyroidism: TSH <0.01, free T4 4.41 on 12/12/2018.  Follows with [place] endocrinology, Dr. Kelton Pillar. -Start methimazole 10 mg twice daily -Will start metoprolol 25 mg p.o. daily as above for symptom control  Type 2 diabetes: A1c 12.5 on 10/05/2018.  Has been on Metformin, is planned to switch to glipizide per her endocrinologist. -Continue sensitive SSI while in hospital  Hyperlipidemia: -Continue atorvastatin  DVT prophylaxis: Eliquis Code Status: Full code, confirmed with patient Family Communication: Discussed with patient, she has discussed with family Disposition Plan: Likely discharge to home pending clinical progress Consults called: Cardiology Admission status: Admit - It is my clinical opinion that admission to INPATIENT is reasonable and necessary because of the expectation that this patient will require hospital care that crosses at least 2 midnights  to treat this condition based on the medical complexity of the problems presented.  Given the aforementioned information, the predictability of an adverse outcome is felt to be significant.   Zada Finders MD [facility]  If 7PM-7AM, please contact night-coverage [email or web address]  12/13/2018, 8:11 PM

## 2018-12-13 NOTE — Sedation Documentation (Signed)
Pt denies pain at this time

## 2018-12-13 NOTE — Discharge Instructions (Addendum)
[facility] are in the hospital because of atrial fibrillation.  This is likely because of your hyperthyroidism.  You are started on metoprolol which is a blood pressure and heart rate medication.  Please take this as directed, twice per day.  You were also started on Eliquis twice a day to help prevent the development of blood clots which is a risk factor for atrial fibrillation.  Please follow-up with your endocrinologist.   Information on my medicine - ELIQUIS (apixaban)  This medication education was reviewed with me or my healthcare representative as part of my discharge preparation.  Why was Eliquis prescribed for you? Eliquis was prescribed for you to reduce the risk of a blood clot forming that can cause a stroke if you have a medical condition called atrial fibrillation (a type of irregular heartbeat).  What do You need to know about Eliquis ? Take your Eliquis TWICE DAILY - one tablet in the morning and one tablet in the evening with or without food. If you have difficulty swallowing the tablet whole please discuss with your pharmacist how to take the medication safely.  Take Eliquis exactly as prescribed by your doctor and DO NOT stop taking Eliquis without talking to the doctor who prescribed the medication.  Stopping may increase your risk of developing a stroke.  Refill your prescription before you run out.  After discharge, you should have regular check-up appointments with your healthcare provider that is prescribing your Eliquis.  In the future your dose may need to be changed if your kidney function or weight changes by a significant amount or as you get older.  What do you do if you miss a dose? If you miss a dose, take it as soon as you remember on the same day and resume taking twice daily.  Do not take more than one dose of ELIQUIS at the same time to make up a missed dose.  Important Safety Information A possible side effect of Eliquis is bleeding. You  should call your healthcare provider right away if you experience any of the following: ? Bleeding from an injury or your nose that does not stop. ? Unusual colored urine (red or dark brown) or unusual colored stools (red or black). ? Unusual bruising for unknown reasons. ? A serious fall or if you hit your head (even if there is no bleeding).  Some medicines may interact with Eliquis and might increase your risk of bleeding or clotting while on Eliquis. To help avoid this, consult your healthcare provider or pharmacist prior to using any new prescription or non-prescription medications, including herbals, vitamins, non-steroidal anti-inflammatory drugs (NSAIDs) and supplements.  This website has more information on Eliquis (apixaban): http://www.eliquis.com/eliquis/home

## 2018-12-13 NOTE — ED Notes (Signed)
Pt cardioverted at 120J 

## 2018-12-13 NOTE — Sedation Documentation (Signed)
Pt unable to rate pain at this time

## 2018-12-13 NOTE — ED Triage Notes (Signed)
Vomiting and headache today. Recently had her diabetes medication changed. States her blood sugar was over 200 today which is not normal.

## 2018-12-13 NOTE — Progress Notes (Signed)
Patient arrived by care link from [place] [facility]. VSS stable. Patient on monitor CCMD notified. CHG bath completed. Patient oriented to room and call light.  Paulene Floor, RN  6:34 PM  12/13/2018

## 2018-12-13 NOTE — Sedation Documentation (Signed)
Pt unable to rate pain at this time 

## 2018-12-13 NOTE — Consult Note (Signed)
Cardiology Consult    Patient ID: Sarah Ibarra MRN: [id number], DOB/AGE: [birth year]   Admit date: 12/13/2018 Date of Consult: 12/13/2018  Primary Physician: Sandford Craze'Sullivan, Melissa, NP Primary Cardiologist: No primary care provider on file. Requesting Provider: Dr. Darreld McleanVishal Patel, hospital medicine  Patient Profile    Sarah Stareatricia Kuechle is a 53 y.o. female with a history of hyperthyroidism, DM2, HLD, presented with a-fib RVR, now s/p diltiazem infusion, eliquis, and cardioversion in the ED. She is transferred to [facility] medicine, cardiology consulted for assistance with management.   History of Present Illness    Endocrine saw patient 11/25 and planned to start methimazole for hyperthyroidism, TSH <0.01, free T4 4.41. However today patient presented to ED with GI symptoms, h/a, vomiting, hyperglycemia, found to be in a-fib RVR. She was started on diltiazem drip, ED discussed with Dr. Wyline MoodBranch, started eliquis and cardioverted. She was then in sinus for a few minutes but then returned to a-fib. Then [facility], is back in sinus rhythm on arrival.   Patient reports no personal cardiac history, nor any cardiac history in either of her 2 living parents or siblings. Her mother does have thyroid issues. Patient reports she's had unexplained weight loss recently, as well as GI issues/diarrhea recently that was attributed to her metformin. This AM she started having nausea and vomiting, palpitations, headache, so presented to the ED. She had some SOB but no chest pain, also no syncope or near syncope. In ED she was found to be in a-fib RVR, s/p DCCV and diltiazem drip. She initially went back into a-fib after cardioversion, but on arrival to medicine floor is now back in sinus rhythm.   Currently, patient reports she is asymptomatic with no CP, dyspnea, palpitations.   Past Medical History   Past Medical History:  Diagnosis Date  . Arthritis    low back pain  . Chicken pox as a child  .  Depression with anxiety 11/03/2012  . Diabetes mellitus without complication (HCC)   . Frequent headaches   . Hx: UTI (urinary tract infection)   . Hyperlipidemia   . Hyperthyroidism 10/08/2018  . Migraines   . Mumps as a child  . Overweight   . UTI (urinary tract infection)     Past Surgical History:  Procedure Laterality Date  . CESAREAN SECTION  1993  . TUBAL LIGATION  1993     Allergies  Allergen Reactions  . Morphine And Related     hives   Inpatient Medications    . apixaban  5 mg Oral BID  . atorvastatin  10 mg Oral Daily  . [START ON 12/14/2018] insulin aspart  0-9 Units Subcutaneous TID WC  . methimazole  10 mg Oral BID  . metoprolol tartrate  25 mg Oral BID  . sodium chloride flush  3 mL Intravenous Q12H    Family History    Family History  Problem Relation Age of Onset  . Other Mother        low blood pressure  . Hyperlipidemia Mother   . Multiple sclerosis Son   . Stroke Maternal Grandmother   . Diabetes Maternal Grandmother        type 2  . Alzheimer's disease Paternal Grandfather    She indicated that her mother is alive. She indicated that her father is alive. She indicated that both of her brothers are alive. She indicated that her maternal grandmother is deceased. She indicated that her maternal grandfather is deceased. She indicated that her paternal grandmother  is alive. She indicated that her paternal grandfather is alive. She indicated that both of her daughters are alive. She indicated that only one of her two sons is alive.   Social History    Social History   Socioeconomic History  . Marital status: Single    Spouse name: Not on file  . Number of children: 3  . Years of education: Not on file  . Highest education level: Not on file  Occupational History  . Occupation: Engineer, maintenance at [employer]  . Financial resource strain: Not hard at all  . Food insecurity    Worry: Never true    Inability: Never true  .  Transportation needs    Medical: No    Non-medical: No  Tobacco Use  . Smoking status: Never Smoker  . Smokeless tobacco: Never Used  Substance and Sexual Activity  . Alcohol use: No  . Drug use: No  . Sexual activity: Not Currently    Partners: Male    Comment: lives with mother  Lifestyle  . Physical activity    Days per week: 3 days    Minutes per session: 60 min  . Stress: Not at all  Relationships  . Social connections    Talks on phone: More than three times a week    Gets together: More than three times a week    Attends religious service: More than 4 times per year    Active member of club or organization: No    Attends meetings of clubs or organizations: Never    Relationship status: Not on file  . Intimate partner violence    Fear of current or ex partner: No    Emotionally abused: No    Physically abused: No    Forced sexual activity: No  Other Topics Concern  . 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 + weight loss as mentioned in HPI Cardiovascular:  No chest pain, dyspnea on exertion, edema, orthopnea, paroxysmal nocturnal dyspnea. + palpitations this AM Dermatological: No rash, lesions/masses Respiratory: No cough, dyspnea Urologic: No hematuria, dysuria Abdominal:  +n/v/d, no bright red blood per rectum, melena, or hematemesis Neurologic:  No visual changes, wkns, changes in mental status. All other systems reviewed and are otherwise negative except as noted above.  Physical Exam    Blood pressure (!) 113/51, pulse 100, temperature 97.9 F (36.6 C), temperature source Oral, resp. rate 16, height 5\' 9"  (1.753 m), weight 71.2 kg, SpO2 99 %.    No intake or output data in the 24 hours ending  12/13/18 2149 Wt Readings from Last 3 Encounters:  12/13/18 71.2 kg  12/12/18 70.9 kg  10/05/18 73.5 kg    CONSTITUTIONAL: alert and conversant, well-appearing, nourished, no distress HEENT: oropharynx clear and moist, no mucosal lesions, normal dentition, conjunctiva normal, EOM intact, pupils equal, no lid lag. NECK: supple, no cervical adenopathy CARDIOVASCULAR: Regular rhythm and rate. No gallop, murmur, or rub. Normal S1/S2. Radial pulses intact. JVP non-distended. No carotid bruits. PULMONARY/CHEST WALL: no deformities, normal breath sounds bilaterally, normal work of breathing ABDOMINAL: soft, non-tender, non-distended EXTREMITIES: no edema or muscle atrophy, warm and well-perfused  SKIN: Dry and intact without apparent rashes or wounds.  NEUROLOGIC: alert, normal gait, no abnormal movements, cranial nerves grossly intact.  Labs    Troponin (Point of Care Test) No results for input(s): TROPIPOC in the last 72 hours. No results for input(s): CKTOTAL, CKMB, TROPONINI in the last 72 hours. Lab Results  Component Value Date   WBC 5.4 12/13/2018   HGB 10.3 (L) 12/13/2018   HCT 33.1 (L) 12/13/2018   MCV 79.0 (L) 12/13/2018   PLT 276 12/13/2018    Recent Labs  Lab 12/12/18 0938 12/13/18 1314  NA 138 137  K 4.0 3.9  CL 103 106  CO2 29 22  BUN 17 19  CREATININE 0.57 0.63  CALCIUM 9.9 9.7  PROT 7.3  --   BILITOT 0.4  --   ALKPHOS 71  --   ALT 17  --   AST 17  --   GLUCOSE 235* 242*   Lab Results  Component Value Date   CHOL 161 10/05/2018   HDL 39.40 10/05/2018   LDLCALC 95 10/05/2018   TRIG 136.0 10/05/2018   No results found for: [facility]   Radiology Studies    Dg Chest Port 1 View  Result Date: 12/13/2018 CLINICAL DATA:  Vomiting and headache today. Recent change in her diabetes medicine. EXAM: PORTABLE CHEST 1 VIEW COMPARISON:  None. FINDINGS: Cardiac silhouette is normal in size. No mediastinal or hilar masses. No evidence of adenopathy. Clear lungs.  No  pleural effusion or pneumothorax. Skeletal structures unremarkable. IMPRESSION: No active disease. Electronically Signed   By: Amie Portland M.D.   On: 12/13/2018 13:24    ECG & Cardiac Imaging    - ECG around 120 pm this afternoon shows atrial fibrillation with RVR, rate around 140, non-specific T wave changes   - repeat ECG 235 pm showing sinus tachycardia with a single PAC, rates around 100-110 bpm, otherwise normal ECG    Assessment & Plan     41F with DM2, HLD, recently diagnosed hyperthyroidism, admitted to medicine with new episode of atrial fibrillation RVR s/p DCCV and diltiazem infusion, currently in sinus rhythm. She has been started on eliquis.   The cardiovascular manifestations of hyperthyroidism are best corrected by treating the underlying hyperthyroidism, whether with antithyroid drug, radioactive iodine, or surgery. Additionally beta-blockers can be used to help with palpitations or cardiac symptoms, especially in the presence of atrial fibrillation. While calcium channel blockers are commonly used in the acute treatment of atrial fibrillation, they must be used cautiously in patients with underlying thyrotoxicosis, given their vasodilatory and negative inotropic properties.   Regarding stroke risk and prevention, the role of anticoagulant therapy is less well defined in patients in whom the underlying disease associated with AF can be corrected, as in hyperthyroidism. While Ms. Ralphs warrants anticoagulation due to [facility] score of 2 (female, DM), the duration of this therapy is debated. After successful treatment of her thyroid disorder, and after documentation that AF has not been present for at least three months, it may be reasonable to consider stopping anticoagulation in the future as an outpatient.  - agree with endocrinology consult and treatment of hyperthyroidism - monitor on telemetry overnight, repeat ECG for rhythm changes - keep K > 4.0 and Mg > 2.0 -  continue apixaban 5mg  PO BID, plan to continue at least 61-months and then re-evaluate - start metprolol 25mg  PO BID, continue beta-blocker as outpatient - f/u on TTE tomorrow - we will work to arrange cardiology outpatient follow-up  Signed, Sherryl Manges, MD 12/13/2018, 9:49 PM  For questions or updates, please contact   Please consult [email or web address] for contact info under Cardiology/STEMI.

## 2018-12-13 NOTE — ED Provider Notes (Signed)
[facility] EMERGENCY DEPARTMENT Provider Note   CSN: [id number] Arrival date & time: 12/13/18  1253     History   Chief Complaint Chief Complaint  Patient presents with   Emesis   Headache    HPI Sarah Ibarra is a 53 y.o. female.  She has a history of diabetes and hyperthyroidism.  She was at endocrinology clinic yesterday and they switched her Metformin to glipizide and also added methimazole.  Patient has not started those new medications.  She is complaining of acutely today feeling her heart racing, headache, vomiting 3 times.  No chest pain.  Minimal shortness of breath.  No prior history of A. fib.  Denies alcohol drugs smoking.  No syncope.  No blood in the vomitus.     The history is provided by the patient.  Palpitations Palpitations quality:  Irregular Onset quality:  Sudden Duration:  6 hours Timing:  Constant Progression:  Waxing and waning Chronicity:  New Relieved by:  Nothing Worsened by:  Nothing Ineffective treatments:  None tried Associated symptoms: nausea, shortness of breath and vomiting   Associated symptoms: no back pain, no chest pain, no chest pressure, no cough, no dizziness, no hemoptysis, no leg pain, no lower extremity edema, no malaise/fatigue, no numbness and no weakness   Risk factors: diabetes mellitus and hx of thyroid disease     Past Medical History:  Diagnosis Date   Arthritis    low back pain   Chicken pox as a child   Depression with anxiety 11/03/2012   Diabetes mellitus without complication (HCC)    Frequent headaches    Hx: UTI (urinary tract infection)    Hyperlipidemia    Hyperthyroidism 10/08/2018   Migraines    Mumps as a child   Overweight    UTI (urinary tract infection)     Patient Active Problem List   Diagnosis Date Noted   Type 2 diabetes mellitus with hyperglycemia, without long-term current use of insulin (HCC) 12/12/2018   Hyperthyroidism 10/08/2018   Depression with anxiety  11/03/2012   Arthritis 11/03/2012   Migraines    Hx: UTI (urinary tract infection)    Hyperlipidemia    Diabetes mellitus without complication (HCC)    Overweight     Past Surgical History:  Procedure Laterality Date   CESAREAN SECTION  1993   TUBAL LIGATION  1993     OB History   No obstetric history on file.      Home Medications    Prior to Admission medications   Medication Sig Start Date End Date Taking? Authorizing Provider  atorvastatin (LIPITOR) 10 MG tablet Take 1 tablet (10 mg total) by mouth daily. 10/08/18   Sandford Craze, NP  glipiZIDE (GLUCOTROL) 10 MG tablet Take 1 tablet (10 mg total) by mouth 2 (two) times daily before a meal. 12/12/18   Shamleffer, Konrad Dolores, MD  metFORMIN (GLUCOPHAGE) 500 MG tablet Take 1 tablet (500 mg total) by mouth 2 (two) times daily with a meal. 10/08/18   Sandford Craze, NP  methimazole (TAPAZOLE) 10 MG tablet Take 1 tablet (10 mg total) by mouth 2 (two) times daily. 12/12/18   Shamleffer, Konrad Dolores, MD    Family History Family History  Problem Relation Age of Onset   Other Mother        low blood pressure   Hyperlipidemia Mother    Multiple sclerosis Son    Stroke Maternal Grandmother    Diabetes Maternal Grandmother  type 2   Alzheimer's disease Paternal Grandfather     Social History Social History   Tobacco Use   Smoking status: Never Smoker   Smokeless tobacco: Never Used  Substance Use Topics   Alcohol use: No   Drug use: No     Allergies   Morphine and related   Review of Systems Review of Systems  Constitutional: Negative for fever and malaise/fatigue.  HENT: Negative for sore throat.   Eyes: Negative for visual disturbance.  Respiratory: Positive for shortness of breath. Negative for cough and hemoptysis.   Cardiovascular: Positive for palpitations. Negative for chest pain.  Gastrointestinal: Positive for nausea and vomiting. Negative for abdominal pain.    Genitourinary: Negative for dysuria.  Musculoskeletal: Negative for back pain.  Skin: Negative for rash.  Neurological: Positive for headaches. Negative for dizziness, weakness and numbness.     Physical Exam Updated Vital Signs BP 127/67 (BP Location: Right Arm)    Pulse (!) 140    Temp 98.2 F (36.8 C) (Oral)    Resp 20    Ht  (1.753 m)    Wt 71.2 kg    SpO2 98%    BMI 23.18 kg/m   Physical Exam Vitals signs and nursing note reviewed.  Constitutional:      General: She is not in acute distress.    Appearance: She is well-developed.  HENT:     Head: Normocephalic and atraumatic.  Eyes:     Conjunctiva/sclera: Conjunctivae normal.  Neck:     Musculoskeletal: Neck supple.     Thyroid: No thyroid mass or thyroid tenderness.     Trachea: Trachea normal.  Cardiovascular:     Rate and Rhythm: Tachycardia present. Rhythm irregular.     Heart sounds: No murmur.  Pulmonary:     Effort: Pulmonary effort is normal. No respiratory distress.     Breath sounds: Normal breath sounds.  Abdominal:     Palpations: Abdomen is soft.     Tenderness: There is no abdominal tenderness.  Musculoskeletal: Normal range of motion.     Right lower leg: No edema.     Left lower leg: No edema.  Skin:    General: Skin is warm and dry.     Capillary Refill: Capillary refill takes less than 2 seconds.  Neurological:     General: No focal deficit present.     Mental Status: She is alert.      ED Treatments / Results  Labs (all labs ordered are listed, but only abnormal results are displayed) Labs Reviewed  BASIC METABOLIC PANEL - Abnormal; Notable for the following components:      Result Value   Glucose, Bld 242 (*)    All other components within normal limits  CBC - Abnormal; Notable for the following components:   Hemoglobin 10.3 (*)    HCT 33.1 (*)    MCV 79.0 (*)    MCH 24.6 (*)    All other components within normal limits  CBG MONITORING, ED - Abnormal; Notable for the  following components:   Glucose-Capillary 228 (*)    All other components within normal limits  SARS CORONAVIRUS 2 AG (30 MIN TAT)  SARS CORONAVIRUS 2 (TAT 6-24 HRS)  MAGNESIUM    EKG EKG Interpretation  Date/Time:  Thursday December 13 2018 14:35:51 EST Ventricular Rate:  107 PR Interval:    QRS Duration: 77 QT Interval:  337 QTC Calculation: 450 R Axis:   63 Text Interpretation: Sinus tachycardia Atrial  premature complex Baseline wander in lead(s) V3 sinus replacing afib Confirmed by Aletta Edouard [phone]) on 12/13/2018 2:41:04 PM   Radiology Dg Chest Port 1 View  Result Date: 12/13/2018 CLINICAL DATA:  Vomiting and headache today. Recent change in her diabetes medicine. EXAM: PORTABLE CHEST 1 VIEW COMPARISON:  None. FINDINGS: Cardiac silhouette is normal in size. No mediastinal or hilar masses. No evidence of adenopathy. Clear lungs.  No pleural effusion or pneumothorax. Skeletal structures unremarkable. IMPRESSION: No active disease. Electronically Signed   By: Lajean Manes M.D.   On: 12/13/2018 13:24    Procedures .Sedation  Date/Time: 12/13/2018 3:08 PM Performed by: Hayden Rasmussen, MD Authorized by: Hayden Rasmussen, MD   Consent:    Consent obtained:  Verbal   Consent given by:  Patient   Risks discussed:  Allergic reaction, dysrhythmia, inadequate sedation, nausea, prolonged hypoxia resulting in organ damage, prolonged sedation necessitating reversal, respiratory compromise necessitating ventilatory assistance and intubation and vomiting   Alternatives discussed:  Analgesia without sedation, anxiolysis and regional anesthesia Universal protocol:    Procedure explained and questions answered to patient or proxy's satisfaction: yes     Relevant documents present and verified: yes     Test results available and properly labeled: yes     Imaging studies available: yes     Required blood products, implants, devices, and special equipment available: yes      Site/side marked: yes     Immediately prior to procedure a time out was called: yes     Patient identity confirmation method:  Verbally with patient and arm band Indications:    Procedure performed:  Cardioversion   Procedure necessitating sedation performed by:  Physician performing sedation Pre-sedation assessment:    Time since last food or drink:  6 hours   ASA classification: class 2 - patient with mild systemic disease     Neck mobility: normal     Mouth opening:  3 or more finger widths   Thyromental distance:  4 finger widths   Mallampati score:  I - soft palate, uvula, fauces, pillars visible   Pre-sedation assessments completed and reviewed: airway patency, cardiovascular function, hydration status, mental status, nausea/vomiting, pain level, respiratory function and temperature     Pre-sedation assessment completed:  12/13/2018 2:00 PM Immediate pre-procedure details:    Reassessment: Patient reassessed immediately prior to procedure     Reviewed: vital signs, relevant labs/tests and NPO status     Verified: bag valve mask available, emergency equipment available, intubation equipment available, IV patency confirmed, oxygen available and suction available   Procedure details (see MAR for exact dosages):    Preoxygenation:  Nasal cannula   Sedation:  Propofol   Intended level of sedation: deep   Intra-procedure monitoring:  Blood pressure monitoring, cardiac monitor, continuous pulse oximetry, frequent LOC assessments, frequent vital sign checks and continuous capnometry   Intra-procedure events: none     Total Provider sedation time (minutes):  10 Post-procedure details:    Post-sedation assessment completed:  12/13/2018 3:09 PM   Attendance: Constant attendance by certified staff until patient recovered     Recovery: Patient returned to pre-procedure baseline     Post-sedation assessments completed and reviewed: airway patency, cardiovascular function, hydration status,  mental status, nausea/vomiting, pain level, respiratory function and temperature     Patient is stable for discharge or admission: yes     Patient tolerance:  Tolerated well, no immediate complications .Cardioversion  Date/Time: 12/13/2018 3:09 PM Performed by: Hayden Rasmussen,  MD Authorized by: Terrilee FilesButler, Natassia Guthridge C, MD   Consent:    Consent obtained:  Written   Consent given by:  Patient   Risks discussed:  Cutaneous burn, death, induced arrhythmia and pain   Alternatives discussed:  Rate-control medication, referral and observation Pre-procedure details:    Cardioversion basis:  Elective   Rhythm:  Atrial fibrillation   Electrode placement:  Anterior-posterior Patient sedated: Yes. Refer to sedation procedure documentation for details of sedation.  Attempt one:    Cardioversion mode:  Synchronous   Shock (joules) attempt one: 120.   Cardioversion outcome attempt one: brief convert to sinus but went back into afib. Post-procedure details:    Patient status:  Awake   Patient tolerance of procedure:  Tolerated well, no immediate complications .Critical Care Performed by: Terrilee FilesButler, Yamel Bale C, MD Authorized by: Terrilee FilesButler, Dreshaun Stene C, MD   Critical care provider statement:    Critical care time (minutes):  45   Critical care was necessary to treat or prevent imminent or life-threatening deterioration of the following conditions:  Cardiac failure   Critical care was time spent personally by me on the following activities:  Discussions with consultants, evaluation of patient's response to treatment, examination of patient, ordering and performing treatments and interventions, ordering and review of laboratory studies, ordering and review of radiographic studies, pulse oximetry, re-evaluation of patient's condition, obtaining history from patient or surrogate, review of old charts and development of treatment plan with patient or surrogate   I assumed direction of critical care for this patient  from another provider in my specialty: no     (including critical care time)  Medications Ordered in ED Medications  diltiazem (CARDIZEM) 1 mg/mL load via infusion 15 mg (15 mg Intravenous Bolus from Bag 12/13/18 1331)    And  diltiazem (CARDIZEM) 100 mg in dextrose 5 % 100 mL (1 mg/mL) infusion (15 mg/hr Intravenous Restarted 12/13/18 1438)  diltiazem (CARDIZEM) 100 MG injection (has no administration in time range)  0.9 %  sodium chloride infusion (1,000 mLs Intravenous New Bag/Given 12/13/18 1325)  propofol (DIPRIVAN) 10 mg/mL bolus/IV push 35.6 mg (35.6 mg Intravenous Given 12/13/18 1435)  apixaban (ELIQUIS) tablet 5 mg (5 mg Oral Given 12/13/18 1501)     Initial Impression / Assessment and Plan / ED Course  I have reviewed the triage vital signs and the nursing notes.  Pertinent labs & imaging results that were available during my care of the patient were reviewed by me and considered in my medical decision making (see chart for details).  Clinical Course as of Dec 12 1725  Thu Dec 13, 2018  [id number] 53 year old female here with nausea vomiting headache palpitations.  Tachycardic with rates ranging between 120 and 150.  Normotensive.  EKG showing A. fib with rapid ventricular response.   [MB]  1318 Differential includes A. fib, thyrotoxicosis, metabolic derangement, dehydration   [MB]  1319 Starting Cardizem drip and bolus.   [MB]  1332 Chest x-ray interpreted by me as no pneumothorax no gross infiltrates.   [MB]  1335 Patient on Cardizem bolus and drip with heart rates closer to 90-110 now.  Pressure remains okay.   [MB]  1345 Patient's labs show her to be anemic, but at her baseline.  Sugar elevated at 242.  Gap normal magnesium normal.  She had a TSH done yesterday that was undetectable.  I talked to her about cardioversion and she is willing to undergo this procedure.  I explained the risks of going back into A.  fib will probably be higher because of her hyperthyroidism.   [MB]   1428 Discussed with Dr. Wyline Mood [facility] cardiology.  He agrees with current plan of cardioversion in the ED and recommends discharge on Eliquis and Lopressor 25 twice daily if heart rate and blood pressure tolerate it   [MB]  1437 Patient cardioverted after written consent and timeout.  She went into sinus rhythm for about a minute but now has broken back into A. fib.   [MB]  1450 Discussed with Dr. Wyline Mood.  He is recommending a medicine admission at [facility] and they will consult on her.  Recommends giving a dose of Eliquis here.   [MB]  1526 Signed out to Dr. Stevie Kern to talk with hospitalist regarding admission to [facility].   [MB]    Clinical Course User Index [MB] Terrilee Files, MD    CHA2DS2/VAS Stroke Risk Points      N/A >= 2 Points: High Risk  1 - 1.99 Points: Medium Risk  0 Points: Low Risk    A final score could not be computed because of missing components.: Last  Change: N/A     This score determines the patient's risk of having a stroke if the  patient has atrial fibrillation.      This score is not applicable to this patient. Components are not  calculated.        Final Clinical Impressions(s) / ED Diagnoses   Final diagnoses:  New onset atrial fibrillation [facility])  Hyperthyroidism    ED Discharge Orders         Ordered    Amb referral to AFIB Clinic     12/13/18 1315           Terrilee Files, MD 12/13/18 1728

## 2018-12-13 NOTE — ED Notes (Signed)
Pt remains in a-fib.

## 2018-12-13 NOTE — ED Notes (Signed)
Report given to Alonna Buckler

## 2018-12-13 NOTE — ED Notes (Signed)
Pt provided soup and water per request.

## 2018-12-14 ENCOUNTER — Inpatient Hospital Stay (HOSPITAL_COMMUNITY): Payer: 59

## 2018-12-14 DIAGNOSIS — E0590 Thyrotoxicosis, unspecified without thyrotoxic crisis or storm: Secondary | ICD-10-CM

## 2018-12-14 DIAGNOSIS — I4891 Unspecified atrial fibrillation: Secondary | ICD-10-CM

## 2018-12-14 DIAGNOSIS — E119 Type 2 diabetes mellitus without complications: Secondary | ICD-10-CM

## 2018-12-14 DIAGNOSIS — E7800 Pure hypercholesterolemia, unspecified: Secondary | ICD-10-CM | POA: Diagnosis not present

## 2018-12-14 LAB — GLUCOSE, CAPILLARY: Glucose-Capillary: 183 mg/dL — ABNORMAL HIGH (ref 70–99)

## 2018-12-14 LAB — CBC
HCT: 28.8 % — ABNORMAL LOW (ref 36.0–46.0)
Hemoglobin: 9.3 g/dL — ABNORMAL LOW (ref 12.0–15.0)
MCH: 24.9 pg — ABNORMAL LOW (ref 26.0–34.0)
MCHC: 32.3 g/dL (ref 30.0–36.0)
MCV: 77 fL — ABNORMAL LOW (ref 80.0–100.0)
Platelets: 260 10*3/uL (ref 150–400)
RBC: 3.74 MIL/uL — ABNORMAL LOW (ref 3.87–5.11)
RDW: 12.2 % (ref 11.5–15.5)
WBC: 4.9 10*3/uL (ref 4.0–10.5)
nRBC: 0 % (ref 0.0–0.2)

## 2018-12-14 LAB — ECHOCARDIOGRAM COMPLETE
Height: 69 in
Weight: 2420.8 oz

## 2018-12-14 LAB — BASIC METABOLIC PANEL
Anion gap: 10 (ref 5–15)
BUN: 12 mg/dL (ref 6–20)
CO2: 25 mmol/L (ref 22–32)
Calcium: 9.3 mg/dL (ref 8.9–10.3)
Chloride: 103 mmol/L (ref 98–111)
Creatinine, Ser: 0.49 mg/dL (ref 0.44–1.00)
GFR calc Af Amer: >60 mL/min (ref >60)
GFR calc non Af Amer: >60 mL/min (ref >60)
Glucose, Bld: 306 mg/dL — ABNORMAL HIGH (ref 70–99)
Potassium: 3.7 mmol/L (ref 3.5–5.1)
Sodium: 138 mmol/L (ref 135–145)

## 2018-12-14 LAB — MAGNESIUM: Magnesium: 1.8 mg/dL (ref 1.7–2.4)

## 2018-12-14 MED ORDER — METOPROLOL TARTRATE 50 MG PO TABS: 50.0000 mg | ORAL_TABLET | Freq: Two times a day (BID) | ORAL | Status: DC

## 2018-12-14 MED ORDER — METOPROLOL TARTRATE 37.5 MG PO TABS: 37.5000 mg | ORAL_TABLET | Freq: Two times a day (BID) | ORAL | 0 refills | Status: DC

## 2018-12-14 MED ORDER — METOPROLOL TARTRATE 25 MG PO TABS
37.5000 mg | ORAL_TABLET | Freq: Two times a day (BID) | ORAL | Status: DC
  Filled 2018-12-14: qty 1

## 2018-12-14 MED ORDER — APIXABAN 5 MG PO TABS: 5.0000 mg | ORAL_TABLET | Freq: Two times a day (BID) | ORAL | 0 refills | Status: DC

## 2018-12-14 NOTE — Discharge Summary (Addendum)
Physician Discharge Summary  Navpreet Szczygiel ZOX:[id number] DOB: [birth year] DOA: 12/13/2018  PCP: Debbrah Alar, NP  Admit date: 12/13/2018 Discharge date: 12/14/2018  Admitted From: Home Disposition: Home  Recommendations for Outpatient Follow-up:  1. Follow up with PCP in 1 week 2. Follow up with endocrinologist 3. Follow up with cardiology 4. Please follow up on the following pending results: None  Home Health: None Equipment/Devices: None  Discharge Condition: Stable CODE STATUS: Full code Diet recommendation: Heart healthy   Brief/Interim Summary:  Admission HPI written by Mckinley Jewel, MD   Chief Complaint: Palpitations  HPI: Sarah Ibarra is a 53 y.o. female with medical history significant for type 2 diabetes, hyperlipidemia, depression with anxiety, and recently diagnosed hyperthyroidism who presented to [facility] ED for evaluation of palpitations.  Patient had lab work performed 2 months ago by PCP indicative of hypothyroidism.  She was seen by her endocrinologist yesterday 12/12/2018 and was planned to start methimazole 10 mg twice daily which she has not yet started.  Repeat labs showed TSH <0.01, free T4 4.41.  Patient states she was in her usual state of health until earlier this morning while she was cooking when she developed new onset of significant palpitations.  She has associated nausea with vomiting.  She denied having any associated chest pain, dyspnea, fevers, chills, diaphoresis, diarrhea, edema, or swelling at her neck.  She denies any similar episode in the past.  Her only other medications are Metformin which is being changed to glipizide by her endocrinologist and atorvastatin.  ED Course:  Initial vitals showed BP 133/56, pulse 103, RR 18, temp 90.8 Fahrenheit, SPO2 100% on room air.  Labs were notable for sodium 137, potassium 3.9, chloride 106, bicarb 22, BUN 19, creatinine 0.63, serum glucose 242, magnesium 1.7,  WBC 5.4, hemoglobin 10.3, platelets 276,000.  SARS-CoV-2 rapid antigen test was negative.  SARS-CoV-2 PCR test was collected and pending.  Portable chest x-ray was negative for acute cardiopulmonary process.  Initial EKG showed atrial fibrillation with rate 142, no prior for comparison.  EDP discussed the case with on-call cardiology who agreed with cardioversion.  Patient was cardioverted in the ED with transient sinus rhythm which returned into atrial fibrillation after approximately 1 minute.  EP note.  EDP again discussed with cardiology who recommended admission to [facility], continuing IV diltiazem, and continuing Eliquis.  The hospitalist service was consulted to admit for further management and evaluation.   Hospital course:  New onset atrial fibrillation with RVR Likely in setting of hyperthyroidism. Unsure of type. Failed cardioversion in the ED priot to admission. Initially started on diltiazem IV and transitioned to metoprolol PO. Eliquis started after cardioversion. Cardiology consulted and recommended increasing to metoprolol 37.5 mg BID on discharge in addition to continuing Eliquis 5 mg BID for at least several months post-cardioversion; recommendations against life-long anticoagulation pending improvement of hyperthyroidism. Outpatient cardiology follow-up.  Hyperthyroidism Continue methimazole. Started on Metoprolol as mentioned above.  Diabetes mellitus, type 2 Patient follows with endocrinology. Metformin discontinued as an outpatient and started on glipizide. Continue on discharge.  Hyperlipidemia Continue Lipitor   Discharge Diagnoses:  Principal Problem:   Atrial fibrillation with RVR (HCC) Active Problems:   Hyperlipidemia   Diabetes mellitus without complication [facility])   Hyperthyroidism    Discharge Instructions  Discharge Instructions    Amb referral to [facility]   Complete by: As directed    Call MD for:  difficulty breathing,  headache or visual disturbances  Complete by: As directed    Call MD for:  extreme fatigue   Complete by: As directed    Call MD for:  temperature >100.4   Complete by: As directed      Allergies as of 12/14/2018      Reactions   Morphine And Related Hives      Medication List    STOP taking these medications   glipiZIDE 10 MG tablet Commonly known as: GLUCOTROL   metFORMIN 500 MG tablet Commonly known as: GLUCOPHAGE     TAKE these medications   apixaban 5 MG Tabs tablet Commonly known as: ELIQUIS Take 1 tablet (5 mg total) by mouth 2 (two) times daily.   atorvastatin 10 MG tablet Commonly known as: LIPITOR Take 1 tablet (10 mg total) by mouth daily.   methimazole 10 MG tablet Commonly known as: TAPAZOLE Take 1 tablet (10 mg total) by mouth 2 (two) times daily.   Metoprolol Tartrate 37.5 MG Tabs Take 37.5 mg by mouth 2 (two) times daily.      Follow-up Information    Debbrah Alar, NP. Schedule an appointment as soon as possible for a visit in 1 week(s).   Specialty: Internal Medicine Why: Hospital follow-up Contact information: [address] [place] [address] [phone]        Sueanne Margarita, MD Follow up.   Specialty: Cardiology Why: Our office will call you to schedule follow-up appointment. If you do not hear from [place] by Tuesday 12/18/2018. Contact information: [address] [place] [address] [phone]          Allergies  Allergen Reactions  . Morphine And Related Hives    Consultations:  Cardiology   Procedures/Studies: Dg Chest Port 1 View  Result Date: 12/13/2018 CLINICAL DATA:  Vomiting and headache today. Recent change in her diabetes medicine. EXAM: PORTABLE CHEST 1 VIEW COMPARISON:  None. FINDINGS: Cardiac silhouette is normal in size. No mediastinal or hilar masses. No evidence of adenopathy. Clear lungs.  No pleural effusion or pneumothorax. Skeletal structures unremarkable.  IMPRESSION: No active disease. Electronically Signed   By: Lajean Manes M.D.   On: 12/13/2018 13:24    11/27: Transthoracic EchocardiogramIMPRESSIONS    1. Left ventricular ejection fraction, by visual estimation, is 60 to 65%. The left ventricle has normal function. There is no left ventricular hypertrophy.  2. Global right ventricle has normal systolic function.The right ventricular size is normal.  3. Left atrial size was normal.  4. Right atrial size was normal.  5. Mild mitral annular calcification.  6. The mitral valve is normal in structure. Trace mitral valve regurgitation. No evidence of mitral stenosis.  7. The tricuspid valve is normal in structure. Tricuspid valve regurgitation is trivial.  8. The aortic valve is normal in structure. Aortic valve regurgitation is not visualized. No evidence of aortic valve sclerosis or stenosis.  9. The pulmonic valve was normal in structure. Pulmonic valve regurgitation is trivial. 10. The inferior vena cava is normal in size with greater than 50% respiratory variability, suggesting right atrial pressure of 3 mmHg. 11. Normal LV systolic and diastolic function; no significant valvular abnormality.    Subjective: Mild headache. No palpitations.  Discharge Exam: Vitals:   12/14/18 0621 12/14/18 0818  BP:  115/62  Pulse: 99 84  Resp: 19 18  Temp:  98.2 F (36.8 C)  SpO2: 99% 100%   Vitals:   12/13/18 2008 12/14/18 5462 12/14/18 7035 12/14/18 0818  BP: (!) 113/51 (!) 109/47  115/62  Pulse: 100 85 99 84  Resp: _0 Temp: 97.9 F (36.6 C) (!) 97.4 F (36.3 C)  98.2 F (36.8 C)  TempSrc: Oral Oral  Oral  SpO2: 99% 98% 99% 100%  Weight:   68.6 kg   Height:        General: Pt is alert, awake, not in acute distress Cardiovascular: RRR, S1/S2 +, no rubs, no gallops Respiratory: CTA bilaterally, no wheezing, no rhonchi Abdominal: Soft, NT, ND, bowel sounds + Extremities: no edema, no cyanosis    The results of  significant diagnostics from this hospitalization (including imaging, microbiology, ancillary and laboratory) are listed below for reference.     Microbiology: Recent Results (from the past 240 hour(s))  SARS Coronavirus 2 Ag (30 min TAT) - Nasal Swab (BD Veritor Kit)     Status: None   Collection Time: 12/13/18  2:52 PM   Specimen: Nasal Swab (BD Veritor Kit)  Result Value Ref Range Status   SARS Coronavirus 2 Ag NEGATIVE NEGATIVE Final    Comment: (NOTE) SARS-CoV-2 antigen NOT DETECTED.  Negative results are presumptive.  Negative results do not preclude SARS-CoV-2 infection and should not be used as the sole basis for treatment or other patient management decisions, including infection  control decisions, particularly in the presence of clinical signs and  symptoms consistent with COVID-19, or in those who have been in contact with the virus.  Negative results must be combined with clinical observations, patient history, and epidemiological information. The expected result is Negative. Fact Sheet for Patients: PodPark.tn Fact Sheet for Healthcare Providers: GiftContent.is This test is not yet approved or cleared by the [place] FDA and  has been authorized for detection and/or diagnosis of SARS-CoV-2 by FDA under an Emergency Use Authorization (EUA).  This EUA will remain in effect (meaning this test can be used) for the duration of  the COVID-19 de claration under Section 564(b)(1) of the Act, 21 U.S.C. section 360bbb-3(b)(1), unless the authorization is terminated or revoked sooner. Performed at [facility], [address] [place], [place] [address]   SARS CORONAVIRUS 2 (TAT 6-24 HRS) Nasopharyngeal Nasopharyngeal Swab     Status: None   Collection Time: 12/13/18  3:34 PM   Specimen: Nasopharyngeal Swab  Result Value Ref Range Status   SARS Coronavirus 2 NEGATIVE NEGATIVE Final    Comment: (NOTE)  SARS-CoV-2 target nucleic acids are NOT DETECTED. The SARS-CoV-2 RNA is generally detectable in upper and lower respiratory specimens during the acute phase of infection. Negative results do not preclude SARS-CoV-2 infection, do not rule out co-infections with other pathogens, and should not be used as the sole basis for treatment or other patient management decisions. Negative results must be combined with clinical observations, patient history, and epidemiological information. The expected result is Negative. Fact Sheet for Patients: SugarRoll.be Fact Sheet for Healthcare Providers: https://www.woods-mathews.com/ This test is not yet approved or cleared by the [place] FDA and  has been authorized for detection and/or diagnosis of SARS-CoV-2 by FDA under an Emergency Use Authorization (EUA). This EUA will remain  in effect (meaning this test can be used) for the duration of the COVID-19 declaration under Section 56 4(b)(1) of the Act, 21 U.S.C. section 360bbb-3(b)(1), unless the authorization is terminated or revoked sooner. Performed at [facility], [place] [address] [place], [place] [address]      Labs: BNP (last 3 results) No results for  input(s): BNP in the last 8760 hours. Basic Metabolic Panel: Recent Labs  Lab 12/12/18 0938 12/13/18 1314 12/14/18 0251  NA 138 137 138  K 4.0 3.9 3.7  CL 103 106 103  CO2 _0 GLUCOSE 235* 242* 306*  BUN _1 CREATININE 0.57 0.63 0.49  CALCIUM 9.9 9.7 9.3  MG  --  1.7 1.8   Liver Function Tests: Recent Labs  Lab 12/12/18 0938  AST 17  ALT 17  ALKPHOS 71  BILITOT 0.4  PROT 7.3  ALBUMIN 3.5   No results for input(s): LIPASE, AMYLASE in the last 168 hours. No results for input(s): AMMONIA in the last 168 hours. CBC: Recent Labs  Lab 12/12/18 0938 12/13/18 1314 12/14/18 0251  WBC 6.1 5.4 4.9  NEUTROABS 1.8  --   --   HGB 10.2* 10.3* 9.3*  HCT 31.7* 33.1*  28.8*  MCV 77.0* 79.0* 77.0*  PLT 276.0 276 260   Cardiac Enzymes: No results for input(s): CKTOTAL, CKMB, CKMBINDEX, TROPONINI in the last 168 hours. BNP: Invalid input(s): POCBNP CBG: Recent Labs  Lab 12/13/18 1303 12/13/18 1841 12/13/18 2041 12/14/18 0619  GLUCAP 228* 201* 174* 183*   D-Dimer No results for input(s): DDIMER in the last 72 hours. Hgb A1c No results for input(s): HGBA1C in the last 72 hours. Lipid Profile No results for input(s): CHOL, HDL, LDLCALC, TRIG, CHOLHDL, LDLDIRECT in the last 72 hours. Thyroid function studies Recent Labs    12/12/18 0938  TSH <0.01*   Anemia work up No results for input(s): VITAMINB12, FOLATE, FERRITIN, TIBC, IRON, RETICCTPCT in the last 72 hours. Urinalysis No results found for: COLORURINE, APPEARANCEUR, [place], [place], [place], [place], [place], [place], PROTEINUR, UROBILINOGEN, NITRITE, LEUKOCYTESUR Sepsis Labs Invalid input(s): PROCALCITONIN,  WBC,  LACTICIDVEN Microbiology Recent Results (from the past 240 hour(s))  SARS Coronavirus 2 Ag (30 min TAT) - Nasal Swab (BD Veritor Kit)     Status: None   Collection Time: 12/13/18  2:52 PM   Specimen: Nasal Swab (BD Veritor Kit)  Result Value Ref Range Status   SARS Coronavirus 2 Ag NEGATIVE NEGATIVE Final    Comment: (NOTE) SARS-CoV-2 antigen NOT DETECTED.  Negative results are presumptive.  Negative results do not preclude SARS-CoV-2 infection and should not be used as the sole basis for treatment or other patient management decisions, including infection  control decisions, particularly in the presence of clinical signs and  symptoms consistent with COVID-19, or in those who have been in contact with the virus.  Negative results must be combined with clinical observations, patient history, and epidemiological information. The expected result is Negative. Fact Sheet for Patients: PodPark.tn Fact Sheet for Healthcare Providers:  GiftContent.is This test is not yet approved or cleared by the [place] FDA and  has been authorized for detection and/or diagnosis of SARS-CoV-2 by FDA under an Emergency Use Authorization (EUA).  This EUA will remain in effect (meaning this test can be used) for the duration of  the COVID-19 de claration under Section 564(b)(1) of the Act, 21 U.S.C. section 360bbb-3(b)(1), unless the authorization is terminated or revoked sooner. Performed at [facility], [address] [place], [place] [address]   SARS CORONAVIRUS 2 (TAT 6-24 HRS) Nasopharyngeal Nasopharyngeal Swab     Status: None   Collection Time: 12/13/18  3:34 PM   Specimen: Nasopharyngeal Swab  Result Value Ref Range Status   SARS Coronavirus 2 NEGATIVE NEGATIVE Final    Comment: (NOTE) SARS-CoV-2 target  nucleic acids are NOT DETECTED. The SARS-CoV-2 RNA is generally detectable in upper and lower respiratory specimens during the acute phase of infection. Negative results do not preclude SARS-CoV-2 infection, do not rule out co-infections with other pathogens, and should not be used as the sole basis for treatment or other patient management decisions. Negative results must be combined with clinical observations, patient history, and epidemiological information. The expected result is Negative. Fact Sheet for Patients: SugarRoll.be Fact Sheet for Healthcare Providers: https://www.woods-mathews.com/ This test is not yet approved or cleared by the [place] FDA and  has been authorized for detection and/or diagnosis of SARS-CoV-2 by FDA under an Emergency Use Authorization (EUA). This EUA will remain  in effect (meaning this test can be used) for the duration of the COVID-19 declaration under Section 56 4(b)(1) of the Act, 21 U.S.C. section 360bbb-3(b)(1), unless the authorization is terminated or revoked sooner. Performed at [facility], [place] [address] [place], [place] [address]      Time coordinating discharge: 35 minutes  SIGNED:   Cordelia Poche, MD [facility] 12/14/2018, 1:35 PM

## 2018-12-14 NOTE — Progress Notes (Addendum)
Progress Note  Patient Name: Sarah Ibarra Date of Encounter: 12/14/2018  Primary Cardiologist: [facility] (Dr. Mayford Knife)  Subjective   No acute overnight events. Maintaining sinus rhythm. Reports mild headache this morning but no chest pain or shortness of breath, lightheadedness, or dizziness. No more palpitations.  Inpatient Medications    Scheduled Meds: . apixaban  5 mg Oral BID  . atorvastatin  10 mg Oral Daily  . insulin aspart  0-9 Units Subcutaneous TID WC  . methimazole  10 mg Oral BID  . metoprolol tartrate  25 mg Oral BID  . sodium chloride flush  3 mL Intravenous Q12H   Continuous Infusions: . sodium chloride 1,000 mL (12/13/18 1325)   PRN Meds: sodium chloride, acetaminophen **OR** acetaminophen, ondansetron **OR** ondansetron (ZOFRAN) IV   Vital Signs    Vitals:   12/13/18 2008 12/14/18 0318 12/14/18 0621 12/14/18 0818  BP: (!) 113/51 (!) 109/47  115/62  Pulse: 100 85 99 84  Resp: 16 16 19 18   Temp: 97.9 F (36.6 C) (!) 97.4 F (36.3 C)  98.2 F (36.8 C)  TempSrc: Oral Oral  Oral  SpO2: 99% 98% 99% 100%  Weight:   68.6 kg   Height:        Intake/Output Summary (Last 24 hours) at 12/14/2018 0918 Last data filed at 12/14/2018 0400 Gross per 24 hour  Intake 201.62 ml  Output -  Net 201.62 ml   Last 3 Weights 12/14/2018 12/13/2018 12/13/2018  Weight (lbs) 151 lb 4.8 oz 156 lb 15.5 oz 157 lb  Weight (kg) 68.629 kg 71.2 kg 71.215 kg      Telemetry    Normal sinus rhythm/sinus tachycardia with rates in the 80's to low 100's and occasional PVCs. - Personally Reviewed  ECG    No new ECG tracing today. - Personally Reviewed  Physical Exam   GEN: No acute distress.   Neck: Supple. No JVD. Cardiac: Borderline tachycardic with regular rhythm. no murmurs, rubs, or gallops.  Respiratory: Clear to auscultation bilaterally. GI: Soft, nontender, non-distended. Bowel sounds present. MS: No edema. No deformity. Skin: Warm and dry. Neuro:  No focal  deficits. Psych: Normal affect.  Labs    High Sensitivity Troponin:  No results for input(s): TROPONINIHS in the last 720 hours.    Chemistry Recent Labs  Lab 12/12/18 0938 12/13/18 1314 12/14/18 0251  NA 138 137 138  K 4.0 3.9 3.7  CL 103 106 103  CO2 29 22 25   GLUCOSE 235* 242* 306*  BUN 17 19 12   CREATININE 0.57 0.63 0.49  CALCIUM 9.9 9.7 9.3  PROT 7.3  --   --   ALBUMIN 3.5  --   --   AST 17  --   --   ALT 17  --   --   ALKPHOS 71  --   --   BILITOT 0.4  --   --   GFRNONAA  --  >60 >60  GFRAA  --  >60 >60  ANIONGAP  --  9 10     Hematology Recent Labs  Lab 12/12/18 0938 12/13/18 1314 12/14/18 0251  WBC 6.1 5.4 4.9  RBC 4.12 4.19 3.74*  HGB 10.2* 10.3* 9.3*  HCT 31.7* 33.1* 28.8*  MCV 77.0* 79.0* 77.0*  MCH  --  24.6* 24.9*  MCHC 32.3 31.1 32.3  RDW 13.1 12.1 12.2  PLT 276.0 276 260    BNPNo results for input(s): BNP, PROBNP in the last 168 hours.   DDimer No results  for input(s): DDIMER in the last 168 hours.   Radiology    Dg Chest Port 1 View  Result Date: 12/13/2018 CLINICAL DATA:  Vomiting and headache today. Recent change in her diabetes medicine. EXAM: PORTABLE CHEST 1 VIEW COMPARISON:  None. FINDINGS: Cardiac silhouette is normal in size. No mediastinal or hilar masses. No evidence of adenopathy. Clear lungs.  No pleural effusion or pneumothorax. Skeletal structures unremarkable. IMPRESSION: No active disease. Electronically Signed   By: Lajean Manes M.D.   On: 12/13/2018 13:24    Cardiac Studies   Echo pending.  Patient Profile     53 y.o. female with a history of hyperthyroidism, hyperlipidemia, and type 2 diabetes mellitus who presented to [facility] ED on 12/13/2018 in atrial fibrillation with RVR. She was started on IV Diltiazem and Eliquis and underwent cardioversion in the ED but returned to atrial fibrillation after a few minutes. Patient was transferred to [facility] for further management.  Assessment & Plan     Atrial Fibrillation with RVR - Likely due to recently diagnosed hyperthyroidism. - Back in normal sinus rhythm with rates in the 80's to low 100's. - Potassium 3.7. - Magnesium 1.8. - Echo pending. - Will increase Lopressor to 37.5mg  twice daily. - CHA2DS2-VASc = 2 (DM, gender). Will need at least 4 weeks of anticoagulation post cardioversion. Continue Eliquis 5mg  twice daily.  - Can likely be discharged today pending Echo results. Will help arrange outpatient follow-up.  Hyperlipidemia - Continue home statin.  Hyperthyroidism - Management per primary team.    For questions or updates, please contact [place] Please consult [email or web address] for contact info under        Signed, Darreld Mclean, PA-C  12/14/2018, 9:18 AM

## 2018-12-14 NOTE — TOC Benefit Eligibility Note (Signed)
Transition of Care [facility]) Benefit Eligibility Note    Patient Details  Name: Sarah Ibarra MRN: [id number] Date of Birth: Oct 22, 1965   Medication/Dose: Eliquis 5mg  bid  Covered?: Yes  Tier: 2 Drug  Prescription Coverage Preferred Pharmacy: [facility]     Co-Pay: 20% of total cost 30 day retail [place]: $524.32     Deductible: Unmet($474.63 remaining)       Delorse Lek Phone Number: 12/14/2018, 12:56 PM

## 2018-12-14 NOTE — Progress Notes (Signed)
  Echocardiogram 2D Echocardiogram has been performed.  Johny Chess 12/14/2018, 9:29 AM

## 2018-12-17 ENCOUNTER — Telehealth: Payer: Self-pay | Admitting: *Deleted

## 2018-12-17 NOTE — Telephone Encounter (Signed)
Transition Care Management Follow-up Telephone Call   Date discharged?12/14/18   How have you been since you were released from the hospital? "I am doing well"   Do you understand why you were in the hospital? yes   Do you understand the discharge instructions? yes   Where were you discharged to? home   Items Reviewed:  Medications reviewed: yes  Allergies reviewed: yes  Dietary changes reviewed: yes  Referrals reviewed: yes   Functional Questionnaire:   Activities of Daily Living (ADLs):   She states they are independent in the following: ambulation, bathing and hygiene, feeding, continence, grooming, toileting and dressing States they require assistance with the following: n/a   Any transportation issues/concerns?: no   Any patient concerns? no   Confirmed importance and date/time of follow-up visits scheduled yes  Provider Appointment booked with PCP 12/19/18  Confirmed with patient if condition begins to worsen call PCP or go to the ER.  Patient was given the office number and encouraged to call back with question or concerns.  : yes

## 2018-12-17 NOTE — Telephone Encounter (Signed)
Noted  

## 2018-12-19 ENCOUNTER — Encounter: Payer: Self-pay | Admitting: Family

## 2018-12-19 ENCOUNTER — Other Ambulatory Visit: Payer: Self-pay

## 2018-12-19 ENCOUNTER — Ambulatory Visit (INDEPENDENT_AMBULATORY_CARE_PROVIDER_SITE_OTHER): Payer: 59 | Admitting: Family

## 2018-12-19 VITALS — BP 116/57 | HR 85 | Temp 96.6°F | Resp 16 | Ht 69.0 in | Wt 156.0 lb

## 2018-12-19 DIAGNOSIS — I4891 Unspecified atrial fibrillation: Secondary | ICD-10-CM

## 2018-12-19 DIAGNOSIS — E0590 Thyrotoxicosis, unspecified without thyrotoxic crisis or storm: Secondary | ICD-10-CM

## 2018-12-19 NOTE — Progress Notes (Signed)
Subjective:    Patient ID: Sarah Ibarra, female    DOB: [birth year], 53 y.o.   MRN: [id number]  HPI   Patient is a 53 yr old female who presents today for hospital follow up.  Hospital discharge summary is reviewed.  The patient was admitted 11/26 to 12/14/2018.  She presented to the emergency department with chief complaint of palpitations.  She had associated nausea and vomiting.  She was noted to be in atrial fibrillation with rate of 142 on initial EKG.  Cardioversion was performed.  She had transient sinus rhythm following cardioversion but returned to atrial fibrillation after approximately 1 minute.  She was admitted for IV diltiazem, rate control and anticoagulation.  She reports that she has not had any CP/SOB or palpitations.  Denies  current swelling.     Review of Systems See HPI  Past Medical History:  Diagnosis Date  . Arthritis    low back pain  . Chicken pox as a child  . Depression with anxiety 11/03/2012  . Diabetes mellitus without complication ([place])   . Frequent headaches   . Hx: UTI (urinary tract infection)   . Hyperlipidemia   . Hyperthyroidism 10/08/2018  . Migraines   . Mumps as a child  . Overweight   . UTI (urinary tract infection)      Social History   Socioeconomic History  . Marital status: Single    Spouse name: Not on file  . Number of children: 3  . Years of education: Not on file  . Highest education level: Not on file  Occupational History  . Occupation: Engineer, maintenance at [employer]  . Financial resource strain: Not hard at all  . Food insecurity    Worry: Never true    Inability: Never true  . Transportation needs    Medical: No    Non-medical: No  Tobacco Use  . Smoking status: Never Smoker  . Smokeless tobacco: Never Used  Substance and Sexual Activity  . Alcohol use: No  . Drug use: No  . Sexual activity: Not Currently    Partners: Male    Comment: lives with mother  Lifestyle  . Physical activity   Days per week: 3 days    Minutes per session: 60 min  . Stress: Not at all  Relationships  . Social connections    Talks on phone: More than three times a week    Gets together: More than three times a week    Attends religious service: More than 4 times per year    Active member of club or organization: No    Attends meetings of clubs or organizations: Never    Relationship status: Not on file  . Intimate partner violence    Fear of current or ex partner: No    Emotionally abused: No    Physically abused: No    Forced sexual activity: No  Other Topics Concern  . 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 CESAREAN SECTION  1993  . TUBAL LIGATION  1993    Family History  Problem Relation Age of Onset  . Other Mother  low blood pressure  . Hyperlipidemia Mother   . Multiple sclerosis Son   . Stroke Maternal Grandmother   . Diabetes Maternal Grandmother        type 2  . Alzheimer's disease Paternal Grandfather     Allergies  Allergen Reactions  . Morphine And Related Hives    Current Outpatient Medications on File Prior to Visit  Medication Sig Dispense Refill  . apixaban (ELIQUIS) 5 MG TABS tablet Take 1 tablet (5 mg total) by mouth 2 (two) times daily. 60 tablet 0  . atorvastatin (LIPITOR) 10 MG tablet Take 1 tablet (10 mg total) by mouth daily. 30 tablet 3  . glipiZIDE (GLUCOTROL) 10 MG tablet Take 10 mg by mouth 2 (two) times daily.    . methimazole (TAPAZOLE) 10 MG tablet Take 1 tablet (10 mg total) by mouth 2 (two) times daily. 60 tablet 6  . Metoprolol Tartrate 37.5 MG TABS Take 37.5 mg by mouth 2 (two) times daily. 60 tablet 0   No current facility-administered medications on file prior to visit.      BP (!) 116/57 (BP Location: Right Arm, Patient Position: Sitting, Cuff Size: Small)   Pulse 85   Temp (!) 96.6 F (35.9 C) (Temporal)   Resp 16   Ht 5\' 9"  (1.753 m)   Wt 156 lb (70.8 kg)   SpO2 100%   BMI 23.04 kg/m       Objective:   Physical Exam Constitutional:      Appearance: She is well-developed.  Neck:     Musculoskeletal: Neck supple.     Thyroid: No thyromegaly.  Cardiovascular:     Rate and Rhythm: Normal rate and regular rhythm.     Heart sounds: Normal heart sounds. No murmur.  Pulmonary:     Effort: Pulmonary effort is normal. No respiratory distress.     Breath sounds: Normal breath sounds. No wheezing.  Skin:    General: Skin is warm and dry.  Neurological:     Mental Status: She is alert and oriented to person, place, and time.  Psychiatric:        Behavior: Behavior normal.        Thought Content: Thought content normal.        Judgment: Judgment normal.           Assessment & Plan:  Atrial fibrillation-rate is stable today.  She is not having any symptoms of palpitations or shortness of breath.  She is maintained on Eliquis.  She is also maintained on metoprolol 37.5 twice daily.  She is advised to keep her upcoming appointment with cardiology.  Hypothyroid-she is now being treated with Tapazole.  Will defer management to endocrinology.  This visit occurred during the SARS-CoV-2 public health emergency.  Safety protocols were in place, including screening questions prior to the visit, additional usage of staff PPE, and extensive cleaning of exam room while observing appropriate contact time as indicated for disinfecting solutions.

## 2018-12-24 LAB — TRAB (TSH RECEPTOR BINDING ANTIBODY): TRAB: 4.91 IU/L — ABNORMAL HIGH (ref <=2.00)

## 2018-12-24 LAB — TEST AUTHORIZATION

## 2018-12-24 LAB — GLUTAMIC ACID DECARBOXYLASE AUTO ABS: Glutamic Acid Decarb Ab: <5 IU/mL (ref <5)

## 2019-01-06 NOTE — Patient Instructions (Addendum)
Medication Instructions:  Start, Metoprolol Tartrate 25 mg twice a day   *If you need a refill on your cardiac medications before your next appointment, please call your pharmacy*  Lab Work: None ordered   If you have labs (blood work) drawn today and your tests are completely normal, you will receive your results only by: Marland Kitchen MyChart Message (if you have MyChart) OR . A paper copy in the mail If you have any lab test that is abnormal or we need to change your treatment, we will call you to review the results.  Testing/Procedures: None ordered   Follow-Up: You are scheduled to see Dr. Radford Pax on 03/18/2019 @ 8:20 AM  Other Instructions  Lifestyle Modifications to Prevent and Treat Heart Disease -Recommend heart healthy/Mediterranean diet, with whole grains, fruits, vegetables, fish, lean meats, nuts, olive oil and avocado oil.  -Limit salt intake to less than 2000 mg per day.  -Recommend moderate walking, starting slowly with a few minutes and working up to 3-5 times/week for 30-50 minutes each session. Aim for at least 150 minutes.week. Goal should be pace of 3 miles/hours, or walking 1.5 miles in 30 minutes -Recommend avoidance of tobacco products. Avoid excess alcohol. -Keep blood pressure well controlled, ideally less than 130/80.

## 2019-01-06 NOTE — Progress Notes (Signed)
Cardiology Office Note:    Date:  01/07/2019   ID:  Sarah Ibarra, DOB 1965-10-15, MRN [id number]  PCP:  Debbrah Alar, NP  Cardiologist:  Fransico Him, MD  Referring MD: Debbrah Alar, NP   Chief Complaint  Patient presents with  . Hospitalization Follow-up  . Atrial Fibrillation    History of Present Illness:    Sarah Ibarra is a 53 y.o. female with a past medical history significant for 2, hyperlipidemia, depression with anxiety, hyperthyroidism.  The patient was admitted to the hospital 11/26 -12/14/2018 with complaints of palpitations and was found to be in atrial fibrillation with rapid ventricular response, likely due to recent be diagnosed hyperthyroidism.  Her TSH was undetectable.  She was treated with Cardizem and electrical cardioversion in the ED, however, the cardioversion did not hold.  She was started on Lopressor which was increased to 37.5 mg twice daily.  She was started on anticoagulation with Eliquis 5 mg twice daily for a CHA2DS2-VASc score of 2 (DM and female).  She was discharged home in sinus rhythm and plan is that if she continues to maintain sinus rhythm after she is euthyroid, would not continue long-term anticoagulation past a few months.  Echocardiogram showed normal LVEF of 60% and normal biatrial size.  Sarah Ibarra is here today. She had one episode of brief flutters about a week ago, but nothing since. No chest pain, shortness of breath, orthopnea or PND. Her ankles have been swelling by the end of the day, go down overnight. She has not been eating more salt than usual.  She is not taking metorpolol as she says it was not called in and not at her pharmacy. She is taking the eliquis as directed.    Cardiac studies   Echocardiogram December 14, 2018  IMPRESSIONS  1. Left ventricular ejection fraction, by visual estimation, is 60 to 65%. The left ventricle has normal function. There is no left ventricular hypertrophy.  2. Global right  ventricle has normal systolic function.The right ventricular size is normal.  3. Left atrial size was normal.  4. Right atrial size was normal.  5. Mild mitral annular calcification.  6. The mitral valve is normal in structure. Trace mitral valve regurgitation. No evidence of mitral stenosis.  7. The tricuspid valve is normal in structure. Tricuspid valve regurgitation is trivial.  8. The aortic valve is normal in structure. Aortic valve regurgitation is not visualized. No evidence of aortic valve sclerosis or stenosis.  9. The pulmonic valve was normal in structure. Pulmonic valve regurgitation is trivial. 10. The inferior vena cava is normal in size with greater than 50% respiratory variability, suggesting right atrial pressure of 3 mmHg. 11. Normal LV systolic and diastolic function; no significant valvular abnormality.   Past Medical History:  Diagnosis Date  . Arthritis    low back pain  . Chicken pox as a child  . Depression with anxiety 11/03/2012  . Diabetes mellitus without complication ([place])   . Frequent headaches   . Hx: UTI (urinary tract infection)   . Hyperlipidemia   . Hyperthyroidism 10/08/2018  . Migraines   . Mumps as a child  . Overweight   . UTI (urinary tract infection)     Past Surgical History:  Procedure Laterality Date  . CESAREAN SECTION  1993  . TUBAL LIGATION  1993    Current Medications: Current Meds  Medication Sig  . apixaban (ELIQUIS) 5 MG TABS tablet Take 1 tablet (5 mg total) by mouth 2 (two)  times daily.  Marland Kitchen atorvastatin (LIPITOR) 10 MG tablet Take 1 tablet (10 mg total) by mouth daily.  Marland Kitchen glipiZIDE (GLUCOTROL) 10 MG tablet Take 10 mg by mouth 2 (two) times daily.  . methimazole (TAPAZOLE) 10 MG tablet Take 1 tablet (10 mg total) by mouth 2 (two) times daily.  . [DISCONTINUED] Metoprolol Tartrate 37.5 MG TABS Take 37.5 mg by mouth 2 (two) times daily.     Allergies:   Morphine and related   Social History   Socioeconomic History  .  Marital status: Single    Spouse name: Not on file  . Number of children: 3  . Years of education: Not on file  . Highest education level: Not on file  Occupational History  . Occupation: Catering manager at [employer]  Tobacco Use  . Smoking status: Never Smoker  . Smokeless tobacco: Never Used  Substance and Sexual Activity  . Alcohol use: No  . Drug use: No  . Sexual activity: Not Currently    Partners: Male    Comment: lives with mother  Other Topics Concern  . 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 Difficulty of Paying Living Expenses: Not on file  Food Insecurity:   . Worried About Programme researcher, broadcasting/film/video in the Last Year: Not on file  . Ran Out of Food in the Last Year: Not on file  Transportation Needs:   . Lack of Transportation (Medical): Not on file  . Lack of Transportation (Non-Medical): Not on file  Physical Activity:   . Days of Exercise per Week: Not on file  . Minutes of Exercise per Session: Not on file  Stress:   . Feeling of Stress : Not on file  Social Connections:   . Frequency of Communication with Friends and Family: Not on file  . Frequency of Social Gatherings with Friends and Family: Not on file  . Attends Religious Services: Not on file  . Active Member of Clubs or Organizations: Not on file  . Attends Banker Meetings: Not on file  . Marital Status: Not on file     Family History: The patient's family history includes Alzheimer's disease in her paternal grandfather; Diabetes in her maternal grandmother; Hyperlipidemia in her mother; Multiple sclerosis in her son; Other in her mother; Stroke in her maternal grandmother. ROS:   Please see the history of present  illness.     All other systems reviewed and are negative.   EKG:  EKG is not ordered today.    Recent Labs: 12/12/2018: ALT 17; TSH <0.01 12/14/2018: BUN 12; Creatinine, Ser 0.49; Hemoglobin 9.3; Magnesium 1.8; Platelets 260; Potassium 3.7; Sodium 138   Recent Lipid Panel    Component Value Date/Time   CHOL 161 10/05/2018 0948   TRIG 136.0 10/05/2018 0948   HDL 39.40 10/05/2018 0948   CHOLHDL 4 10/05/2018 0948   VLDL 27.2 10/05/2018 0948   LDLCALC 95 10/05/2018 0948    Physical Exam:    VS:  BP 112/62   Pulse 81   Ht  (1.753 m)   Wt 158 lb 12.8 oz (72 kg)   SpO2 98%   BMI 23.45  kg/m     Wt Readings from Last 6 Encounters:  01/07/19 158 lb 12.8 oz (72 kg)  12/19/18 156 lb (70.8 kg)  12/14/18 151 lb 4.8 oz (68.6 kg)  12/12/18 156 lb 6.4 oz (70.9 kg)  10/05/18 162 lb (73.5 kg)  05/30/17 172 lb 6.4 oz (78.2 kg)     Physical Exam  Constitutional: She is oriented to person, place, and time. She appears well-developed and well-nourished. No distress.  HENT:  Head: Normocephalic and atraumatic.  Neck: No JVD present.  Cardiovascular: Normal rate, regular rhythm, normal heart sounds and intact distal pulses. Exam reveals no gallop and no friction rub.  No murmur heard. Pulmonary/Chest: Effort normal and breath sounds normal. No respiratory distress. She has no wheezes.  Abdominal: Soft. Bowel sounds are normal.  Musculoskeletal:        General: No edema. Normal range of motion.     Cervical back: Normal range of motion and neck supple.  Neurological: She is alert and oriented to person, place, and time.  Skin: Skin is warm and dry.  Psychiatric: She has a normal mood and affect. Her behavior is normal. Judgment and thought content normal.  Vitals reviewed.    ASSESSMENT:    1. Atrial fibrillation with RVR (HCC)   2. Hyperthyroidism    PLAN:    In order of problems listed above:  Atrial fibrillation with RVR -Recent hospitalization for A. fib with RVR  likely due to newly diagnosed hyperthyroidism.  Discharged in sinus rhythm on beta-blocker. -Echocardiogram showed normal LVEF and normal biatrial size. -The patient is on anticoagulation with Eliquis 5 mg twice daily for stroke risk reduction with CHA2DS2-VASc of 2 (DM, female).  Will need at least 4 weeks of anticoagulation as she underwent cardioversion at the hospital.  Can likely stop anticoagulation after a couple of months if maintaining sinus rhythm while euthyroid. Will have her follow up with Dr. Mayford Knife for that determination.  -Heart rhythm is regular on exam.  -Pt is not taking metoprolol as she says it was not at her pharmacy to pick up after discharge. She did have a brief fluttering last week. I will send in metoprolol 25 mg BID.   Hyperthyroidism -Management per endocrinology, has follow-up appointment on 1/7.  Started on methimazole.  Hyperlipidemia -On statin.   Medication Adjustments/Labs and Tests Ordered: Current medicines are reviewed at length with the patient today.  Concerns regarding medicines are outlined above. Labs and tests ordered and medication changes are outlined in the patient instructions below:  Patient Instructions  Medication Instructions:  Start, Metoprolol Tartrate 25 mg twice a day   *If you need a refill on your cardiac medications before your next appointment, please call your pharmacy*  Lab Work: None ordered   If you have labs (blood work) drawn today and your tests are completely normal, you will receive your results only by: Marland Kitchen MyChart Message (if you have MyChart) OR . A paper copy in the mail If you have any lab test that is abnormal or we need to change your treatment, we will call you to review the results.  Testing/Procedures: None ordered   Follow-Up: You are scheduled to see Dr. Mayford Knife on 03/18/2019 @ 8:20 AM  Other Instructions  Lifestyle Modifications to Prevent and Treat Heart Disease -Recommend heart healthy/Mediterranean  diet, with whole grains, fruits, vegetables, fish, lean meats, nuts, olive oil and avocado oil.  -Limit salt intake to less than 2000 mg per day.  -Recommend moderate walking, starting  slowly with a few minutes and working up to 3-5 times/week for 30-50 minutes each session. Aim for at least 150 minutes.week. Goal should be pace of 3 miles/hours, or walking 1.5 miles in 30 minutes -Recommend avoidance of tobacco products. Avoid excess alcohol. -Keep blood pressure well controlled, ideally less than 130/80.      Signed, Berton BonJanine Keian Odriscoll, NP  01/07/2019 10:19 AM    [facility]

## 2019-01-07 ENCOUNTER — Other Ambulatory Visit: Payer: Self-pay

## 2019-01-07 ENCOUNTER — Encounter: Payer: Self-pay | Admitting: Cardiology

## 2019-01-07 ENCOUNTER — Ambulatory Visit (INDEPENDENT_AMBULATORY_CARE_PROVIDER_SITE_OTHER): Payer: 59 | Admitting: Cardiology

## 2019-01-07 VITALS — BP 112/62 | HR 81 | Ht 69.0 in | Wt 158.8 lb

## 2019-01-07 DIAGNOSIS — E0590 Thyrotoxicosis, unspecified without thyrotoxic crisis or storm: Secondary | ICD-10-CM | POA: Diagnosis not present

## 2019-01-07 DIAGNOSIS — I4891 Unspecified atrial fibrillation: Secondary | ICD-10-CM | POA: Diagnosis not present

## 2019-01-07 MED ORDER — METOPROLOL TARTRATE 25 MG PO TABS: 25.0000 mg | ORAL_TABLET | Freq: Two times a day (BID) | ORAL | 3 refills | Status: DC

## 2019-01-10 ENCOUNTER — Other Ambulatory Visit: Payer: Self-pay | Admitting: Internal Medicine

## 2019-01-24 ENCOUNTER — Ambulatory Visit: Payer: 59 | Admitting: Internal Medicine

## 2019-02-04 ENCOUNTER — Ambulatory Visit: Payer: 59 | Admitting: Dietician

## 2019-03-04 ENCOUNTER — Other Ambulatory Visit: Payer: Self-pay | Admitting: Internal Medicine

## 2019-03-06 ENCOUNTER — Other Ambulatory Visit: Payer: Self-pay

## 2019-03-06 MED ORDER — ATORVASTATIN CALCIUM 10 MG PO TABS: 10.0000 mg | ORAL_TABLET | Freq: Every day | ORAL | 3 refills | Status: DC

## 2019-03-08 ENCOUNTER — Ambulatory Visit: Payer: 59 | Admitting: Dietician

## 2019-03-17 NOTE — Progress Notes (Signed)
Cardiology Office Note:    Date:  03/18/2019   ID:  Sarah Ibarra, DOB [birth year], MRN [id number]  PCP:  Sandford Craze, NP  Cardiologist:  Armanda Magic, MD    Referring MD: Sandford Craze, NP   Chief Complaint  Patient presents with  . Atrial Fibrillation  . Hypertension    History of Present Illness:    Sarah Ibarra is a 54 y.o. female with a hx of hyperlipidemia, PAFand anxiety.  She had new onset PAF in 11/2018 in setting of hyperthyroidism.  Rx'd with Cardizem and DCCV but reverted back to afib.  Started on BB and anticoagulation with Eliquis 5mg  BID for CHADS2-VASC score of 2.  She has maintained NSR since then.  2D echo showed normal LVF.    She is here today for followup and is doing well.  She denies any chest pain or pressure, SOB, DOE, PND, orthopnea, LE edema, dizziness, palpitations or syncope. She is compliant with her meds and is tolerating meds with no SE.    Past Medical History:  Diagnosis Date  . Arthritis    low back pain  . Chicken pox as a child  . Depression with anxiety 11/03/2012  . Diabetes mellitus without complication (HCC)   . Frequent headaches   . Hx: UTI (urinary tract infection)   . Hyperlipidemia   . Hyperthyroidism 10/08/2018  . Migraines   . Mumps as a child  . Overweight   . UTI (urinary tract infection)     Past Surgical History:  Procedure Laterality Date  . CESAREAN SECTION  1993  . TUBAL LIGATION  1993    Current Medications: Current Meds  Medication Sig  . apixaban (ELIQUIS) 5 MG TABS tablet Take 1 tablet (5 mg total) by mouth 2 (two) times daily.  10/10/2018 atorvastatin (LIPITOR) 10 MG tablet Take 1 tablet (10 mg total) by mouth daily.  Marland Kitchen glipiZIDE (GLUCOTROL) 10 MG tablet TAKE 1 TABLET(10 MG) BY MOUTH TWICE DAILY BEFORE A MEAL  . methimazole (TAPAZOLE) 10 MG tablet TAKE 1 TABLET(10 MG) BY MOUTH TWICE DAILY  . metoprolol tartrate (LOPRESSOR) 25 MG tablet Take 1 tablet (25 mg total) by mouth 2 (two) times daily.      Allergies:   Morphine and related   Social History   Socioeconomic History  . Marital status: Single    Spouse name: Not on file  . Number of children: 3  . Years of education: Not on file  . Highest education level: Not on file  Occupational History  . Occupation: Marland Kitchen at [employer]  Tobacco Use  . Smoking status: Never Smoker  . Smokeless tobacco: Never Used  Substance and Sexual Activity  . Alcohol use: No  . Drug use: No  . Sexual activity: Not Currently    Partners: Male    Comment: lives with mother  Other Topics Concern  . Not on file  Social History Narrative   Works at [employer] center as a [employer], never married   3 children (2 daughters local) Son in [place], 5 grandchildren   Lives with her mother who has a [place]   Enjoys Nurse, mental health   Social Determinants of Diplomatic Services operational officer Strain:   . Difficulty of Paying Living Expenses: Not on file  Food Insecurity:   . Worried About Corporate investment banker in the Last Year: Not on file  . Ran Out of Food in the Last Year: Not on file  Transportation  Needs:   . Lack of Transportation (Medical): Not on file  . Lack of Transportation (Non-Medical): Not on file  Physical Activity:   . Days of Exercise per Week: Not on file  . Minutes of Exercise per Session: Not on file  Stress:   . Feeling of Stress : Not on file  Social Connections:   . Frequency of Communication with Friends and Family: Not on file  . Frequency of Social Gatherings with Friends and Family: Not on file  . Attends Religious Services: Not on file  . Active Member of Clubs or Organizations: Not on file  . Attends Archivist Meetings: Not on file  . Marital Status: Not on file     Family History: The patient's family history includes Alzheimer's disease in her paternal grandfather; Diabetes in her maternal grandmother; Hyperlipidemia in her mother; Multiple sclerosis in her son; Other in her  mother; Stroke in her maternal grandmother.  ROS:   Please see the history of present illness.    ROS  All other systems reviewed and negative.   EKGs/Labs/Other Studies Reviewed:    The following studies were reviewed today:   EKG:  EKG is not ordered today.   Recent Labs: 12/12/2018: ALT 17; TSH <0.01 12/14/2018: BUN 12; Creatinine, Ser 0.49; Hemoglobin 9.3; Magnesium 1.8; Platelets 260; Potassium 3.7; Sodium 138   Recent Lipid Panel    Component Value Date/Time   CHOL 161 10/05/2018 0948   TRIG 136.0 10/05/2018 0948   HDL 39.40 10/05/2018 0948   CHOLHDL 4 10/05/2018 0948   VLDL 27.2 10/05/2018 0948   LDLCALC 95 10/05/2018 0948    Physical Exam:    VS:  BP 114/60   Pulse 62   Ht 5\' 9"  (1.753 m)   Wt 172 lb 6.4 oz (78.2 kg)   SpO2 99%   BMI 25.46 kg/m     Wt Readings from Last 3 Encounters:  03/18/19 172 lb 6.4 oz (78.2 kg)  01/07/19 158 lb 12.8 oz (72 kg)  12/19/18 156 lb (70.8 kg)     GEN:  Well nourished, well developed in no acute distress HEENT: Normal NECK: No JVD; No carotid bruits LYMPHATICS: No lymphadenopathy CARDIAC: RRR, no murmurs, rubs, gallops RESPIRATORY:  Clear to auscultation without rales, wheezing or rhonchi  ABDOMEN: Soft, non-tender, non-distended MUSCULOSKELETAL:  No edema; No deformity  SKIN: Warm and dry NEUROLOGIC:  Alert and oriented x 3 PSYCHIATRIC:  Normal affect   ASSESSMENT:    1. PAF (paroxysmal atrial fibrillation) ([place])   2. Hyperthyroidism   3. Pure hypercholesterolemia    PLAN:    In order of problems listed above:  1.  PAF -maintaining NSR on exam -continue Lopressor 25mg  BID -since she has been maintaining NSR and is more than 4 weeks out and afib occurred in setting of acute hyperthyroidism will stop anticoagulation  2.  Hyperthyroidism -per endocrine -continue methimazole -seeing PCP tomorrow for followup on thyroid  3.  HLD -followed by PCP -continue statin -LDL was 95 in Sept 2020 and goal is <  100   Medication Adjustments/Labs and Tests Ordered: Current medicines are reviewed at length with the patient today.  Concerns regarding medicines are outlined above.  No orders of the defined types were placed in this encounter.  No orders of the defined types were placed in this encounter.   Signed, Fransico Him, MD  03/18/2019 8:26 AM    [place]

## 2019-03-18 ENCOUNTER — Encounter: Payer: Self-pay | Admitting: Cardiology

## 2019-03-18 ENCOUNTER — Ambulatory Visit (INDEPENDENT_AMBULATORY_CARE_PROVIDER_SITE_OTHER): Payer: 59 | Admitting: Cardiology

## 2019-03-18 ENCOUNTER — Other Ambulatory Visit: Payer: Self-pay

## 2019-03-18 VITALS — BP 114/60 | HR 62 | Ht 69.0 in | Wt 172.4 lb

## 2019-03-18 DIAGNOSIS — E0590 Thyrotoxicosis, unspecified without thyrotoxic crisis or storm: Secondary | ICD-10-CM

## 2019-03-18 DIAGNOSIS — E7800 Pure hypercholesterolemia, unspecified: Secondary | ICD-10-CM | POA: Diagnosis not present

## 2019-03-18 DIAGNOSIS — I480 Paroxysmal atrial fibrillation: Secondary | ICD-10-CM

## 2019-03-18 NOTE — Patient Instructions (Signed)
Medication Instructions:  Your physician has recommended you make the following change in your medication:  1) STOP taking Eliquis  *If you need a refill on your cardiac medications before your next appointment, please call your pharmacy*  Follow-Up: At [facility], you and your health needs are our priority.  As part of our continuing mission to provide you with exceptional heart care, we have created designated Provider Care Teams.  These Care Teams include your primary Cardiologist (physician) and Advanced Practice Providers (APPs -  Physician Assistants and Nurse Practitioners) who all work together to provide you with the care you need, when you need it.  We recommend signing up for the patient portal called "MyChart".  Sign up information is provided on this After Visit Summary.  MyChart is used to connect with patients for Virtual Visits (Telemedicine).  Patients are able to view lab/test results, encounter notes, upcoming appointments, etc.  Non-urgent messages can be sent to your provider as well.   To learn more about what you can do with MyChart, go to [email or web address].    Your next appointment:   6 month(s)  The format for your next appointment:   In Person  Provider:   Armanda Magic, MD

## 2019-03-19 ENCOUNTER — Encounter: Payer: Self-pay | Admitting: Family

## 2019-03-19 ENCOUNTER — Ambulatory Visit (INDEPENDENT_AMBULATORY_CARE_PROVIDER_SITE_OTHER): Payer: 59 | Admitting: Family

## 2019-03-19 ENCOUNTER — Encounter: Payer: Self-pay | Admitting: Gastroenterology

## 2019-03-19 ENCOUNTER — Other Ambulatory Visit: Payer: Self-pay

## 2019-03-19 VITALS — BP 107/69 | HR 66 | Temp 97.8°F | Resp 16 | Ht 69.0 in | Wt 173.0 lb

## 2019-03-19 DIAGNOSIS — E1165 Type 2 diabetes mellitus with hyperglycemia: Secondary | ICD-10-CM

## 2019-03-19 DIAGNOSIS — E7800 Pure hypercholesterolemia, unspecified: Secondary | ICD-10-CM | POA: Diagnosis not present

## 2019-03-19 DIAGNOSIS — I4891 Unspecified atrial fibrillation: Secondary | ICD-10-CM | POA: Diagnosis not present

## 2019-03-19 DIAGNOSIS — E0590 Thyrotoxicosis, unspecified without thyrotoxic crisis or storm: Secondary | ICD-10-CM

## 2019-03-19 DIAGNOSIS — Z0000 Encounter for general adult medical examination without abnormal findings: Secondary | ICD-10-CM

## 2019-03-19 LAB — COMPREHENSIVE METABOLIC PANEL
ALT: 11 U/L (ref 0–35)
AST: 15 U/L (ref 0–37)
Albumin: 3.8 g/dL (ref 3.5–5.2)
Alkaline Phosphatase: 93 U/L (ref 39–117)
BUN: 15 mg/dL (ref 6–23)
CO2: 29 mEq/L (ref 19–32)
Calcium: 9.7 mg/dL (ref 8.4–10.5)
Chloride: 106 mEq/L (ref 96–112)
Creatinine, Ser: 0.89 mg/dL (ref 0.40–1.20)
GFR: 79.4 mL/min (ref >60.00)
Glucose, Bld: 83 mg/dL (ref 70–99)
Potassium: 4.3 mEq/L (ref 3.5–5.1)
Sodium: 141 mEq/L (ref 135–145)
Total Bilirubin: 0.3 mg/dL (ref 0.2–1.2)
Total Protein: 7.6 g/dL (ref 6.0–8.3)

## 2019-03-19 LAB — MICROALBUMIN / CREATININE URINE RATIO
Creatinine,U: 111.5 mg/dL
Microalb Creat Ratio: 0.6 mg/g (ref 0.0–30.0)
Microalb, Ur: <0.7 mg/dL (ref 0.0–1.9)

## 2019-03-19 LAB — TSH: TSH: 1.33 u[IU]/mL (ref 0.35–4.50)

## 2019-03-19 LAB — HEMOGLOBIN A1C: Hgb A1c MFr Bld: 7.2 % — ABNORMAL HIGH (ref 4.6–6.5)

## 2019-03-19 NOTE — Patient Instructions (Addendum)
Please schedule an appointment with Dr. Lonzo Cloud (endocrinology). Complete lab work prior to leaving.

## 2019-03-19 NOTE — Progress Notes (Signed)
Subjective:    Patient ID: Sarah Ibarra, female    DOB: 06/21/1965, 54 y.o.   MRN: [id number]  HPI   Patient is a 54 yr old female who presents today for follow up.  HTN-  BP Readings from Last 3 Encounters:  03/19/19 107/69  03/18/19 114/60  01/07/19 112/62   DM2-  Reports AM sugars 115-117, later in the day goes up into the 130's.  She is maintained on glipizide.  States she had to cancel her apt with nutrition due to covid in January which she has recovered from.  Thinks she contracted at work.  Lab Results  Component Value Date   HGBA1C 12.5 (H) 10/05/2018   HGBA1C 8.3 (H) 05/30/2017   HGBA1C 9.6 (H) 11/01/2012   Lab Results  Component Value Date   MICROALBUR 0.9 05/30/2017   LDLCALC 95 10/05/2018   CREATININE 0.49 12/14/2018   Hyperthyroid- maintained on tapazole- following with endo. Last visit was in November. A 6 week follow up was recommended.  She has not followed up with endo.  Lab Results  Component Value Date   TSH <0.01 (L) 12/12/2018   Atrial Fibrillation  Lab Results  Component Value Date   CHOL 161 10/05/2018   HDL 39.40 10/05/2018   LDLCALC 95 10/05/2018   TRIG 136.0 10/05/2018   CHOLHDL 4 10/05/2018     Review of Systems Past Medical History:  Diagnosis Date  . Arthritis    low back pain  . Chicken pox as a child  . Depression with anxiety 11/03/2012  . Diabetes mellitus without complication (HCC)   . Frequent headaches   . Hx: UTI (urinary tract infection)   . Hyperlipidemia   . Hyperthyroidism 10/08/2018  . Migraines   . Mumps as a child  . Overweight   . UTI (urinary tract infection)      Social History   Socioeconomic History  . Marital status: Single    Spouse name: Not on file  . Number of children: 3  . Years of education: Not on file  . Highest education level: Not on file  Occupational History  . Occupation: Catering manager at [employer]  Tobacco Use  . Smoking status: Never Smoker  . Smokeless tobacco: Never  Used  Substance and Sexual Activity  . Alcohol use: No  . Drug use: No  . Sexual activity: Not Currently    Partners: Male    Comment: lives with mother  Other Topics Concern  . 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 Difficulty of Paying Living Expenses: Not on file  Food Insecurity:   . Worried About Programme researcher, broadcasting/film/video in the Last Year: Not on file  . Ran Out of Food in the Last Year: Not on file  Transportation Needs:   . Lack of Transportation (Medical): Not on file  . Lack of Transportation (Non-Medical): Not on file  Physical Activity:   . Days of Exercise per Week: Not on file  . Minutes of Exercise per Session: Not on file  Stress:   . Feeling of Stress : Not on file  Social Connections:   . Frequency of Communication with Friends  and Family: Not on file  . Frequency of Social Gatherings with Friends and Family: Not on file  . Attends Religious Services: Not on file  . Active Member of Clubs or Organizations: Not on file  . Attends Archivist Meetings: Not on file  . Marital Status: Not on file  Intimate Partner Violence:   . Fear of Current or Ex-Partner: Not on file  . Emotionally Abused: Not on file  . Physically Abused: Not on file  . Sexually Abused: Not on file    Past Surgical History:  Procedure Laterality Date  . CESAREAN SECTION  1993  . TUBAL LIGATION  1993    Family History  Problem Relation Age of Onset  . Other Mother        low blood pressure  . Hyperlipidemia Mother   . Multiple sclerosis Son   . Stroke Maternal Grandmother   . Diabetes Maternal Grandmother        type 2  . Alzheimer's disease Paternal Grandfather     Allergies  Allergen Reactions  .  Morphine And Related Hives    Current Outpatient Medications on File Prior to Visit  Medication Sig Dispense Refill  . atorvastatin (LIPITOR) 10 MG tablet Take 1 tablet (10 mg total) by mouth daily. 30 tablet 3  . glipiZIDE (GLUCOTROL) 10 MG tablet TAKE 1 TABLET(10 MG) BY MOUTH TWICE DAILY BEFORE A MEAL 60 tablet 0  . methimazole (TAPAZOLE) 10 MG tablet TAKE 1 TABLET(10 MG) BY MOUTH TWICE DAILY 60 tablet 6  . metoprolol tartrate (LOPRESSOR) 25 MG tablet Take 1 tablet (25 mg total) by mouth 2 (two) times daily. 180 tablet 3   No current facility-administered medications on file prior to visit.    BP 107/69 (BP Location: Right Arm, Patient Position: Sitting, Cuff Size: Small)   Pulse 66   Temp 97.8 F (36.6 C) (Oral)   Resp 16   Ht 5\' 9"  (1.753 m)   Wt 173 lb (78.5 kg)   SpO2 100%   BMI 25.55 kg/m       Objective:   Physical Exam Constitutional:      Appearance: She is well-developed.  Neck:     Thyroid: No thyromegaly.  Cardiovascular:     Rate and Rhythm: Normal rate and regular rhythm.     Heart sounds: Normal heart sounds. No murmur.  Pulmonary:     Effort: Pulmonary effort is normal. No respiratory distress.     Breath sounds: Normal breath sounds. No wheezing.  Musculoskeletal:     Cervical back: Neck supple.  Skin:    General: Skin is warm and dry.  Neurological:     Mental Status: She is alert and oriented to person, place, and time.  Psychiatric:        Behavior: Behavior normal.        Thought Content: Thought content normal.        Judgment: Judgment normal.           Assessment & Plan:   PAF- clinically stable. Management per cardiology. Maintained on beta blocker. Now off of anticoagulation.  Hyperlipidemia- ran out of statin a few months back- our notes show that rx was sent on 2/17. Pt will contact pharmacy and restart. Last lipid panel was at goal.  Hyperthyroid- will obtain tsh, continue tapazole, pt is advised to schedule follow up with  Endocrinology.  DM2- per pt report she is having improved control. Continue glucotrol. Obtain follow up A1C, pt to schedule  follow up with endo.   This visit occurred during the SARS-CoV-2 public health emergency.  Safety protocols were in place, including screening questions prior to the visit, additional usage of staff PPE, and extensive cleaning of exam room while observing appropriate contact time as indicated for disinfecting solutions.

## 2019-03-21 ENCOUNTER — Encounter: Payer: Self-pay | Admitting: Gastroenterology

## 2019-03-21 ENCOUNTER — Ambulatory Visit (AMBULATORY_SURGERY_CENTER): Payer: Self-pay | Admitting: *Deleted

## 2019-03-21 ENCOUNTER — Other Ambulatory Visit: Payer: Self-pay

## 2019-03-21 VITALS — Temp 97.3°F | Ht 69.0 in | Wt 174.0 lb

## 2019-03-21 DIAGNOSIS — Z1211 Encounter for screening for malignant neoplasm of colon: Secondary | ICD-10-CM

## 2019-03-21 MED ORDER — CLENPIQ 10-3.5-12 MG-GM -GM/160ML PO SOLN: 1.0000 | ORAL | 0 refills | Status: DC

## 2019-03-21 NOTE — Progress Notes (Signed)
Patient is here in-person for PV. Patient denies any allergies to eggs or soy. Patient denies any problems with anesthesia/sedation. Patient denies any oxygen use at home. Patient denies taking any diet/weight loss medications or blood thinners. Patient is not being treated for MRSA or C-diff. EMMI education assisgned to the patient for the procedure, this was explained and instructions given to patient. . Pt is aware that care partner will wait in the car during procedure; if they feel like they will be too hot or cold to wait in the car; they may wait in the 4 th floor lobby. Patient is aware to bring only one care partner. We want them to wear a mask (we do not have any that we can provide them), practice social distancing, and we will check their temperatures when they get here.  I did remind the patient that their care partner needs to stay in the parking lot the entire time and have a cell phone available, we will call them when the pt is ready for discharge. Patient will wear mask into building.    clenpiq coupon given to the patient.  + covid 01/21/2019 per pt.

## 2019-04-05 ENCOUNTER — Other Ambulatory Visit: Payer: Self-pay

## 2019-04-05 ENCOUNTER — Ambulatory Visit (AMBULATORY_SURGERY_CENTER): Payer: 59 | Admitting: Gastroenterology

## 2019-04-05 ENCOUNTER — Encounter: Payer: Self-pay | Admitting: Gastroenterology

## 2019-04-05 VITALS — BP 115/63 | HR 66 | Temp 96.9°F | Resp 11 | Ht 69.0 in | Wt 174.0 lb

## 2019-04-05 DIAGNOSIS — Z1211 Encounter for screening for malignant neoplasm of colon: Secondary | ICD-10-CM | POA: Diagnosis present

## 2019-04-05 MED ORDER — SODIUM CHLORIDE 0.9 % IV SOLN: 500.0000 mL | Freq: Once | INTRAVENOUS | Status: DC

## 2019-04-05 NOTE — Progress Notes (Signed)
pt tolerated well. VSS. awake and to recovery. Report given to RN.  

## 2019-04-05 NOTE — Op Note (Signed)
[facility] Patient Name: Sarah Ibarra Procedure Date: 04/05/2019 10:35 AM MRN: [id number] Endoscopist: Lynann Bologna , MD Age: 54 Referring MD:  Date of Birth: 1965/08/07 Gender: Female Account #: [id number] Procedure:                Colonoscopy Indications:              Screening for colorectal malignant neoplasm Medicines:                Monitored Anesthesia Care Procedure:                Pre-Anesthesia Assessment:                           - Prior to the procedure, a History and Physical                            was performed, and patient medications and                            allergies were reviewed. The patient's tolerance of                            previous anesthesia was also reviewed. The risks                            and benefits of the procedure and the sedation                            options and risks were discussed with the patient.                            All questions were answered, and informed consent                            was obtained. Prior Anticoagulants: The patient has                            taken no previous anticoagulant or antiplatelet                            agents. ASA Grade Assessment: II - A patient with                            mild systemic disease. After reviewing the risks                            and benefits, the patient was deemed in                            satisfactory condition to undergo the procedure.                           After obtaining informed consent, the colonoscope  was passed under direct vision. Throughout the                            procedure, the patient's blood pressure, pulse, and                            oxygen saturations were monitored continuously. The                            Colonoscope was introduced through the anus and                            advanced to the 2 cm into the ileum. The                            colonoscopy was performed  without difficulty. The                            patient tolerated the procedure well. The quality                            of the bowel preparation was good. The terminal                            ileum, ileocecal valve, appendiceal orifice, and                            rectum were photographed. Scope In: 10:45:08 AM Scope Out: 10:59:41 AM Scope Withdrawal Time: 0 hours 11 minutes 4 seconds  Total Procedure Duration: 0 hours 14 minutes 33 seconds  Findings:                 The colon (entire examined portion) appeared normal.                           Non-bleeding internal hemorrhoids were found during                            retroflexion. The hemorrhoids were moderate.                           The terminal ileum appeared normal.                           The exam was otherwise without abnormality on                            direct and retroflexion views. Complications:            No immediate complications. Estimated Blood Loss:     Estimated blood loss: none. Impression:               - Non-bleeding internal hemorrhoids.                           - Otherwise normal colonoscopy to TI. Recommendation:           -  Patient has a contact number available for                            emergencies. The signs and symptoms of potential                            delayed complications were discussed with the                            patient. Return to normal activities tomorrow.                            Written discharge instructions were provided to the                            patient.                           - Resume previous diet.                           - Continue present medications.                           - Repeat colonoscopy in 10 years for screening                            purposes. Earlier, if with any new problems or if                            there is any change in family history.                           - Return to GI office PRN. Lynann Bologna,  MD 04/05/2019 11:02:35 AM This report has been signed electronically.

## 2019-04-05 NOTE — Patient Instructions (Signed)
YOU HAD AN ENDOSCOPIC PROCEDURE TODAY AT THE [facility]:   Refer to the procedure report that was given to you for any specific questions about what was found during the examination.  If the procedure report does not answer your questions, please call your gastroenterologist to clarify.  If you requested that your care partner not be given the details of your procedure findings, then the procedure report has been included in a sealed envelope for you to review at your convenience later.  YOU SHOULD EXPECT: Some feelings of bloating in the abdomen. Passage of more gas than usual.  Walking can help get rid of the air that was put into your GI tract during the procedure and reduce the bloating. If you had a lower endoscopy (such as a colonoscopy or flexible sigmoidoscopy) you may notice spotting of blood in your stool or on the toilet paper. If you underwent a bowel prep for your procedure, you may not have a normal bowel movement for a few days.  Please Note:  You might notice some irritation and congestion in your nose or some drainage.  This is from the oxygen used during your procedure.  There is no need for concern and it should clear up in a day or so.  SYMPTOMS TO REPORT IMMEDIATELY:   Following lower endoscopy (colonoscopy or flexible sigmoidoscopy):  Excessive amounts of blood in the stool  Significant tenderness or worsening of abdominal pains  Swelling of the abdomen that is new, acute  Fever of 100F or higher  For urgent or emergent issues, a gastroenterologist can be reached at any hour by calling [phone]. Do not use MyChart messaging for urgent concerns.    DIET:  We do recommend a small meal at first, but then you may proceed to your regular diet.  Drink plenty of fluids but you should avoid alcoholic beverages for 24 hours.  ACTIVITY:  You should plan to take it easy for the rest of today and you should NOT DRIVE or use heavy machinery until tomorrow (because  of the sedation medicines used during the test).    FOLLOW UP: Our staff will call the number listed on your records 48-72 hours following your procedure to check on you and address any questions or concerns that you may have regarding the information given to you following your procedure. If we do not reach you, we will leave a message.  We will attempt to reach you two times.  During this call, we will ask if you have developed any symptoms of COVID 19. If you develop any symptoms (ie: fever, flu-like symptoms, shortness of breath, cough etc.) before then, please call [phone].  If you test positive for Covid 19 in the 2 weeks post procedure, please call and report this information to us.    If any biopsies were taken you will be contacted by phone or by letter within the next 1-3 weeks.  Please call us at [phone] if you have not heard about the biopsies in 3 weeks.    SIGNATURES/CONFIDENTIALITY: You and/or your care partner have signed paperwork which will be entered into your electronic medical record.  These signatures attest to the fact that that the information above on your After Visit Summary has been reviewed and is understood.  Full responsibility of the confidentiality of this discharge information lies with you and/or your care-partner. 

## 2019-04-05 NOTE — Progress Notes (Signed)
Pt's states no medical or surgical changes since previsit or office visit.  Vitals- Karina Temp- June 

## 2019-04-09 ENCOUNTER — Telehealth: Payer: Self-pay | Admitting: *Deleted

## 2019-04-09 NOTE — Telephone Encounter (Signed)
  Follow up Call-  Call back number 04/05/2019  Post procedure Call Back phone  # [phone]  Permission to leave phone message Yes  Some recent data might be hidden     Patient questions:  Do you have a fever, pain , or abdominal swelling? No. Pain Score  0 *  Have you tolerated food without any problems? Yes.    Have you been able to return to your normal activities? Yes.    Do you have any questions about your discharge instructions: Diet   No. Medications  No. Follow up visit  No.  Do you have questions or concerns about your Care? No.  Actions: * If pain score is 4 or above: No action needed, pain <4.  1. Have you developed a fever since your procedure? no  2.   Have you had an respiratory symptoms (SOB or cough) since your procedure? no  3.   Have you tested positive for COVID 19 since your procedure no  4.   Have you had any family members/close contacts diagnosed with the COVID 19 since your procedure?  no   If yes to any of these questions please route to Laverna Peace, RN and Charlett Lango, RN

## 2019-04-09 NOTE — Telephone Encounter (Signed)
  Follow up Call-  Call back number 04/05/2019  Post procedure Call Back phone  # [phone]  Permission to leave phone message Yes  Some recent data might be hidden    [facility]

## 2019-05-19 ENCOUNTER — Other Ambulatory Visit: Payer: Self-pay | Admitting: Family

## 2019-06-02 ENCOUNTER — Other Ambulatory Visit: Payer: Self-pay | Admitting: Internal Medicine

## 2019-06-19 ENCOUNTER — Other Ambulatory Visit: Payer: Self-pay

## 2019-06-19 ENCOUNTER — Ambulatory Visit (INDEPENDENT_AMBULATORY_CARE_PROVIDER_SITE_OTHER): Payer: 59 | Admitting: Family

## 2019-06-19 ENCOUNTER — Encounter: Payer: Self-pay | Admitting: Family

## 2019-06-19 VITALS — BP 109/70 | HR 62 | Temp 98.0°F | Resp 16 | Ht 69.0 in | Wt 189.0 lb

## 2019-06-19 DIAGNOSIS — E7800 Pure hypercholesterolemia, unspecified: Secondary | ICD-10-CM

## 2019-06-19 DIAGNOSIS — E1165 Type 2 diabetes mellitus with hyperglycemia: Secondary | ICD-10-CM

## 2019-06-19 LAB — BASIC METABOLIC PANEL
BUN: 18 mg/dL (ref 6–23)
CO2: 30 mEq/L (ref 19–32)
Calcium: 9.2 mg/dL (ref 8.4–10.5)
Chloride: 107 mEq/L (ref 96–112)
Creatinine, Ser: 1.05 mg/dL (ref 0.40–1.20)
GFR: 65.55 mL/min (ref >60.00)
Glucose, Bld: 74 mg/dL (ref 70–99)
Potassium: 3.7 mEq/L (ref 3.5–5.1)
Sodium: 142 mEq/L (ref 135–145)

## 2019-06-19 LAB — HEMOGLOBIN A1C: Hgb A1c MFr Bld: 7.2 % — ABNORMAL HIGH (ref 4.6–6.5)

## 2019-06-19 NOTE — Progress Notes (Signed)
Subjective:    Patient ID: Sarah Ibarra, female    DOB: Jan 30, 1965, 54 y.o.   MRN: [id number]  HPI  Patient is a 54 yr old female who presents today for follow up.  DM2- sometimes spikes up after dinner to around 170. Denies hypoglycemia. Maintained on glucotrol.   Lab Results  Component Value Date   HGBA1C 7.2 (H) 03/19/2019   HGBA1C 12.5 (H) 10/05/2018   HGBA1C 8.3 (H) 05/30/2017   Lab Results  Component Value Date   MICROALBUR <0.7 03/19/2019   [place] 95 10/05/2018   CREATININE 0.89 03/19/2019   Hyperlipidemia- maintained on lipitor.  She continues to follow with endocrinology for her hyperthyroidism.     Review of Systems See HPI  Past Medical History:  Diagnosis Date  . Arthritis    low back pain  . Chicken pox as a child  . Depression with anxiety    pt denies  . Diabetes mellitus without complication ([place])   . Frequent headaches   . Hx: UTI (urinary tract infection)   . Hyperlipidemia   . Hyperthyroidism 10/08/2018  . Migraines   . Mumps as a child  . Overweight   . UTI (urinary tract infection)      Social History   Socioeconomic History  . Marital status: Single    Spouse name: Not on file  . Number of children: 3  . Years of education: Not on file  . Highest education level: Not on file  Occupational History  . Occupation: Engineer, maintenance at [employer]  Tobacco Use  . Smoking status: Never Smoker  . Smokeless tobacco: Never Used  Substance and Sexual Activity  . Alcohol use: No  . Drug use: No  . Sexual activity: Not Currently    Partners: Male    Comment: lives with mother  Other Topics Concern  . 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 Difficulty of Paying  Living Expenses:   Food Insecurity:   . Worried About Charity fundraiser in the Last Year:   . Arboriculturist in the Last Year:   Transportation Needs:   . Film/video editor (Medical):   Marland Kitchen Lack of Transportation (Non-Medical):   Physical Activity:   . Days of Exercise per Week:   . Minutes of Exercise per Session:   Stress:   . Feeling of Stress :   Social Connections:   . Frequency of Communication with Friends and Family:   . Frequency of Social Gatherings with Friends and Family:   . Attends Religious Services:   . Active Member of Clubs or Organizations:   . Attends Archivist Meetings:   Marland Kitchen Marital Status:   Intimate Partner Violence:   . Fear of Current or Ex-Partner:   . Emotionally Abused:   Marland Kitchen Physically Abused:   . Sexually Abused:     Past Surgical History:  Procedure Laterality Date  . CARDIOVERSION  12/13/2018  . CESAREAN SECTION  1993  . TUBAL LIGATION  1993    Family History  Problem Relation Age of Onset  . Other Mother        low blood pressure  .  Hyperlipidemia Mother   . Multiple sclerosis Son   . Stroke Maternal Grandmother   . Diabetes Maternal Grandmother        type 2  . Alzheimer's disease Paternal Grandfather   . Colon cancer Neg Hx   . Colon polyps Neg Hx   . Esophageal cancer Neg Hx   . Stomach cancer Neg Hx   . Rectal cancer Neg Hx     Allergies  Allergen Reactions  . Morphine And Related Hives    Current Outpatient Medications on File Prior to Visit  Medication Sig Dispense Refill  . atorvastatin (LIPITOR) 10 MG tablet Take 1 tablet (10 mg total) by mouth daily. 30 tablet 3  . glipiZIDE (GLUCOTROL) 10 MG tablet TAKE 1 TABLET(10 MG) BY MOUTH TWICE DAILY BEFORE A MEAL 180 tablet 0  . methimazole (TAPAZOLE) 10 MG tablet TAKE 1 TABLET(10 MG) BY MOUTH TWICE DAILY 60 tablet 0  . metoprolol tartrate (LOPRESSOR) 25 MG tablet Take 1 tablet (25 mg total) by mouth 2 (two) times daily. 180 tablet 3   No current  facility-administered medications on file prior to visit.    BP 109/70 (BP Location: Right Arm, Patient Position: Sitting, Cuff Size: Small)   Pulse 62   Temp 98 F (36.7 C) (Temporal)   Resp 16   Ht 5\' 9"  (1.753 m)   Wt 189 lb (85.7 kg)   LMP 04/06/2012   SpO2 99%   BMI 27.91 kg/m       Objective:   Physical Exam Constitutional:      Appearance: She is well-developed.  Cardiovascular:     Rate and Rhythm: Normal rate and regular rhythm.     Heart sounds: Normal heart sounds. No murmur.  Pulmonary:     Effort: Pulmonary effort is normal. No respiratory distress.     Breath sounds: Normal breath sounds. No wheezing.  Psychiatric:        Behavior: Behavior normal.        Thought Content: Thought content normal.        Judgment: Judgment normal.           Assessment & Plan:  DM2- clinically stable on glucotrol. Continue same, obtain follow up A1C.  Hyperlipidemia- continue statin, LDL at goal.  This visit occurred during the SARS-CoV-2 public health emergency.  Safety protocols were in place, including screening questions prior to the visit, additional usage of staff PPE, and extensive cleaning of exam room while observing appropriate contact time as indicated for disinfecting solutions.

## 2019-07-08 ENCOUNTER — Telehealth: Payer: Self-pay

## 2019-07-08 MED ORDER — ATORVASTATIN CALCIUM 10 MG PO TABS: 10.0000 mg | ORAL_TABLET | Freq: Every day | ORAL | 3 refills | Status: DC

## 2019-07-08 NOTE — Telephone Encounter (Signed)
Prescription sent

## 2019-07-08 NOTE — Telephone Encounter (Signed)
Called requesting refill on Atorvastatin.  Pharmacy is [facility], [address] @ [facility] [address]

## 2019-08-17 ENCOUNTER — Other Ambulatory Visit: Payer: Self-pay | Admitting: Family

## 2019-08-19 ENCOUNTER — Telehealth: Payer: Self-pay | Admitting: Family

## 2019-08-19 ENCOUNTER — Other Ambulatory Visit: Payer: Self-pay | Admitting: Family

## 2019-08-19 MED ORDER — GLIPIZIDE 10 MG PO TABS: ORAL_TABLET | ORAL | 2 refills | Status: DC

## 2019-08-19 NOTE — Telephone Encounter (Signed)
Medication refilled to pharmacy.  

## 2019-08-19 NOTE — Telephone Encounter (Signed)
Medication:  glipiZIDE (GLUCOTROL) 10 MG tablet [[id number]]   Has the patient contacted their pharmacy? No. (If no, request that the patient contact the pharmacy for the refill.) (If yes, when and what did the pharmacy advise?)  Preferred Pharmacy (with phone number or street name): [id number] - [place], [place] - [address] AT [facility] [address]  [address], [place] [address]  Phone:  [phone] Fax:  [phone]  DEA #:  [id number]  Agent: Please be advised that RX refills may take up to 3 business days. We ask that you follow-up with your pharmacy.

## 2019-08-19 NOTE — Telephone Encounter (Signed)
Declined due to no f/u appts scheduled per provider.

## 2019-08-19 NOTE — Telephone Encounter (Signed)
Medication: methimazole (TAPAZOLE) 10 MG tablet [[id number]   Has the patient contacted their pharmacy? No. (If no, request that the patient contact the pharmacy for the refill.) (If yes, when and what did the pharmacy advise?)  Preferred Pharmacy (with phone number or street name): [id number] - [place], [place] - [address] [facility] [address]  [address], [place] [address]  Phone:  [phone] Fax:  [phone]  DEA #:  [id number]  Agent: Please be advised that RX refills may take up to 3 business days. We ask that you follow-up with your pharmacy.

## 2019-09-30 ENCOUNTER — Telehealth: Payer: Self-pay | Admitting: Family

## 2019-09-30 ENCOUNTER — Other Ambulatory Visit: Payer: Self-pay | Admitting: Family

## 2019-09-30 ENCOUNTER — Other Ambulatory Visit: Payer: Self-pay

## 2019-09-30 MED ORDER — ATORVASTATIN CALCIUM 10 MG PO TABS: 10.0000 mg | ORAL_TABLET | Freq: Every day | ORAL | 3 refills | Status: DC

## 2019-09-30 NOTE — Telephone Encounter (Signed)
RX SENT

## 2019-09-30 NOTE — Telephone Encounter (Signed)
Patient called to request refill of atorvasatin. Please send to same [facility] in [facility].

## 2019-10-22 ENCOUNTER — Other Ambulatory Visit: Payer: Self-pay

## 2019-10-22 ENCOUNTER — Ambulatory Visit (INDEPENDENT_AMBULATORY_CARE_PROVIDER_SITE_OTHER): Payer: 59 | Admitting: Internal Medicine

## 2019-10-22 ENCOUNTER — Encounter: Payer: Self-pay | Admitting: Internal Medicine

## 2019-10-22 VITALS — BP 120/80 | HR 70 | Ht 69.0 in | Wt 196.0 lb

## 2019-10-22 DIAGNOSIS — E0500 Thyrotoxicosis with diffuse goiter without thyrotoxic crisis or storm: Secondary | ICD-10-CM

## 2019-10-22 DIAGNOSIS — E0590 Thyrotoxicosis, unspecified without thyrotoxic crisis or storm: Secondary | ICD-10-CM | POA: Diagnosis not present

## 2019-10-22 DIAGNOSIS — E1165 Type 2 diabetes mellitus with hyperglycemia: Secondary | ICD-10-CM | POA: Diagnosis not present

## 2019-10-22 LAB — POCT GLYCOSYLATED HEMOGLOBIN (HGB A1C): Hemoglobin A1C: 7.5 % — AB (ref 4.0–5.6)

## 2019-10-22 LAB — T4, FREE: Free T4: 1.1 ng/dL (ref 0.8–1.8)

## 2019-10-22 LAB — T3: T3, Total: 141 ng/dL (ref 76–181)

## 2019-10-22 LAB — TSH: TSH: 2.25 mIU/L (ref 0.40–4.50)

## 2019-10-22 NOTE — Patient Instructions (Signed)
-   Continue Glipizide 10 mg, 1 tablet before Breakfast and Supper   - Stop by the lab today

## 2019-10-22 NOTE — Progress Notes (Signed)
Name: Sarah Ibarra  Age/ Sex: 54 y.o., female   MRN/ DOB: [id number], [birth year]     PCP: Sandford Craze, NP   Reason for Endocrinology Evaluation: Type 2 Diabetes Mellitus  Initial Endocrine Consultative Visit: 12/12/2018    PATIENT IDENTIFIER: Sarah Ibarra is a 54 y.o. female with a past medical history of T2DM and Dyslipidemia. The patient has followed with Endocrinology clinic since 12/12/2018 for consultative assistance with management of her diabetes.  DIABETIC HISTORY:  Sarah Ibarra was diagnosed with DM in 2009, she has been on Metformin since diagnosis.Her hemoglobin A1c has ranged from 8.3% in 2019, peaking at 12.5% in 2020 On her initial visit to our clinic she had an A1c of 12.5%, she declined insulin at the time and opted for Glipizide. We stopped Metformin due to nausea     THYROID HISTORY:  Pt has been noted to have suppressed TSH < 0.01 uIU/mL with elevated FT4 3.11 ng/dL during routine workup. She was noted to have weight loss at the time of her presentation but no other symptoms. She was started on Methimazole 11/2018 TRAB level slightly elevated at 4.91 IU/L   Mother with thyroid disease  SUBJECTIVE:   During the last visit (12/12/2018): A1c 12.5 % stopped Metformin and started Glipizide      Today (10/22/2019): Sarah Ibarra is here for a follow up on diabetes. She has not been here in 11 months.   She checks her blood sugars 1 times daily. The patient has not had hypoglycemic episodes since the last clinic visit.  Denies weight loss Denies diarrhea  Has occasional palpitations    HOME ENDOCRINE REGIMEN:  Glipizide 10 mg BID -  Methimazole 10 mg BID - ran out  A few months ago   Statin: Yes ACE-I/ARB: No    METER DOWNLOAD SUMMARY: Did not bring meter     DIABETIC COMPLICATIONS: Microvascular complications:    Denies: CKD , neuropathy, retinopathy   Last Eye Exam: Completed 10/2018- scheduled 10/16th,2021  Macrovascular  complications:    Denies: CAD, CVA, PVD   HISTORY:  Past Medical History:  Past Medical History:  Diagnosis Date  . Arthritis    low back pain  . Chicken pox as a child  . Depression with anxiety    pt denies  . Diabetes mellitus without complication (HCC)   . Frequent headaches   . Hx: UTI (urinary tract infection)   . Hyperlipidemia   . Hyperthyroidism 10/08/2018  . Migraines   . Mumps as a child  . Overweight   . UTI (urinary tract infection)    Past Surgical History:  Past Surgical History:  Procedure Laterality Date  . CARDIOVERSION  12/13/2018  . CESAREAN SECTION  1993  . TUBAL LIGATION  1993    Social History:  reports that she has never smoked. She has never used smokeless tobacco. She reports that she does not drink alcohol and does not use drugs. Family History:  Family History  Problem Relation Age of Onset  . Other Mother        low blood pressure  . Hyperlipidemia Mother   . Multiple sclerosis Son   . Stroke Maternal Grandmother   . Diabetes Maternal Grandmother        type 2  . Alzheimer's disease Paternal Grandfather   . Colon cancer Neg Hx   . Colon polyps Neg Hx   . Esophageal cancer Neg Hx   . Stomach cancer Neg Hx   . Rectal cancer Neg Hx  HOME MEDICATIONS: Allergies as of 10/22/2019      Reactions   Morphine And Related Hives      Medication List       Accurate as of October 22, 2019  2:01 PM. If you have any questions, ask your nurse or doctor.        atorvastatin 10 MG tablet Commonly known as: LIPITOR TAKE 1 TABLET(10 MG) BY MOUTH DAILY   atorvastatin 10 MG tablet Commonly known as: LIPITOR Take 1 tablet (10 mg total) by mouth daily.   glipiZIDE 10 MG tablet Commonly known as: GLUCOTROL TAKE 1 TABLET(10 MG) BY MOUTH TWICE DAILY BEFORE A MEAL   methimazole 10 MG tablet Commonly known as: TAPAZOLE TAKE 1 TABLET(10 MG) BY MOUTH TWICE DAILY   metoprolol tartrate 25 MG tablet Commonly known as: LOPRESSOR Take 1  tablet (25 mg total) by mouth 2 (two) times daily.        OBJECTIVE:   Vital Signs: BP 120/80   Pulse 70   Ht 5\' 9"  (1.753 m)   Wt 196 lb (88.9 kg)   LMP 04/06/2012   SpO2 97%   BMI 28.94 kg/m   Wt Readings from Last 3 Encounters:  10/22/19 196 lb (88.9 kg)  06/19/19 189 lb (85.7 kg)  04/05/19 174 lb (78.9 kg)     Exam: General: Pt appears well and is in NAD  Neck: General: Supple without adenopathy. Thyroid: Thyroid size normal.  No goiter or nodules appreciated.  Lungs: Clear with good BS bilat with no rales, rhonchi, or wheezes  Heart: RRR with normal S1 and S2 and no gallops; no murmurs; no rub  Abdomen: Normoactive bowel sounds, soft, nontender, without masses or organomegaly palpable  Extremities: No pretibial edema.   Skin: Normal texture and temperature to palpation. No rash noted. No Acanthosis nigricans/skin tags. No lipohypertrophy.  Neuro: MS is good with appropriate affect, pt is alert and Ox3    DM foot exam: 10/22/2019  The skin of the feet is without sores or ulcerations. The pedal pulses are 2+ on right and 2+ on left. The sensation is intact to a screening 5.07, 10 gram monofilament bilaterally    DATA REVIEWED:  Lab Results  Component Value Date   HGBA1C 7.5 (A) 10/22/2019   HGBA1C 7.2 (H) 06/19/2019   HGBA1C 7.2 (H) 03/19/2019   Lab Results  Component Value Date   MICROALBUR <0.7 03/19/2019   LDLCALC 95 10/05/2018   CREATININE 1.05 06/19/2019   Lab Results  Component Value Date   MICRALBCREAT 0.6 03/19/2019     Lab Results  Component Value Date   CHOL 161 10/05/2018   HDL 39.40 10/05/2018   LDLCALC 95 10/05/2018   TRIG 136.0 10/05/2018   CHOLHDL 4 10/05/2018       Results for ESPERANZA, MADRAZO (MRN Delight Stare) as of 10/23/2019 13:45  Ref. Range 10/22/2019 09:53  TSH Latest Ref Range: 0.40 - 4.50 mIU/L 2.25  Triiodothyronine (T3) Latest Ref Range: 76 - 181 ng/dL 12/22/2019  518) Latest Ref Range: 0.8 - 1.8 ng/dL 1.1     ASSESSMENT / PLAN / RECOMMENDATIONS:   1) Type 2 Diabetes Mellitus, suboptimally controlled, With out complications - Most recent A1c of 7.5%. Goal A1c <7.0%.  -On her last visit here which was approximately a year ago she had an A1c of 12.5%, she declined insulin at the time and agreed to start glipizide. -Her A1c is acceptable today, I will not be making any changes -Patient has been encouraged to  continue with avoiding sugar sweetened beverages and continue with a low-carb diet -Emphasized the importance of taking glipizide 20 minutes before first and last meal of the day MEDICATIONS: Continue glipizide 10 mg twice daily EDUCATION / INSTRUCTIONS:  BG monitoring instructions: Patient is instructed to check her blood sugars 2 times a day, fasting and supper.  Call [facility] if: BG persistently < 70  . I reviewed the Rule of 15 for the treatment of hypoglycemia in detail with the patient. Literature supplied.     2) Diabetic complications:   Eye: Does night have known diabetic retinopathy.   Neuro/ Feet: Does not have known diabetic peripheral neuropathy .   Renal: Patient does not have known baseline CKD. She   is not on an ACEI/ARB at present.    3) Hyperthyroidism secondary to Graves' Disease:  -Patient is clinically euthyroid -No local neck symptoms - Repeat TFT's are normal. No need to restart Methimazole at this time     4) Graves' Disease:   - No extrathyroidal manifestations of Graves' disease - In remission     F/U in 3 months   Signed electronically by: Lyndle Herrlich, MD  [facility]  [facility] [address] [place] [place], [place] [address] Phone: [phone] FAX: [phone]   CC: Sandford Craze, NP [address] [place] [address] Phone: [phone]  Fax: [phone]  Return to Endocrinology clinic as below: Future Appointments  Date Time Provider  Department Center  11/20/2019  9:00 AM Sandford Craze, NP [facility]  01/21/2020  8:30 AM Allessandra Bernardi, Konrad Dolores, MD [facility]

## 2019-10-23 DIAGNOSIS — E0500 Thyrotoxicosis with diffuse goiter without thyrotoxic crisis or storm: Secondary | ICD-10-CM | POA: Insufficient documentation

## 2019-11-01 LAB — HM DIABETES EYE EXAM

## 2019-11-20 ENCOUNTER — Encounter: Payer: Self-pay | Admitting: Family

## 2019-11-20 ENCOUNTER — Ambulatory Visit (INDEPENDENT_AMBULATORY_CARE_PROVIDER_SITE_OTHER): Payer: 59 | Admitting: Family

## 2019-11-20 ENCOUNTER — Telehealth: Payer: Self-pay | Admitting: Family

## 2019-11-20 ENCOUNTER — Other Ambulatory Visit: Payer: Self-pay

## 2019-11-20 VITALS — BP 118/79 | HR 66 | Temp 98.3°F | Resp 16 | Ht 69.0 in | Wt 199.8 lb

## 2019-11-20 DIAGNOSIS — Z23 Encounter for immunization: Secondary | ICD-10-CM | POA: Diagnosis not present

## 2019-11-20 DIAGNOSIS — Z0000 Encounter for general adult medical examination without abnormal findings: Secondary | ICD-10-CM

## 2019-11-20 DIAGNOSIS — E1169 Type 2 diabetes mellitus with other specified complication: Secondary | ICD-10-CM

## 2019-11-20 DIAGNOSIS — E785 Hyperlipidemia, unspecified: Secondary | ICD-10-CM | POA: Diagnosis not present

## 2019-11-20 NOTE — Telephone Encounter (Signed)
Records release faxed to [facility]

## 2019-11-20 NOTE — Patient Instructions (Addendum)
Please schedule mammogram on the first floor. Please schedule routine dental visit. Continue regular exercise.  Work on cutting back on "junk" food.

## 2019-11-20 NOTE — Telephone Encounter (Signed)
Please request copy of her DM eye report from [facility].

## 2019-11-20 NOTE — Progress Notes (Signed)
Subjective:    Patient ID: Sarah Ibarra, female    DOB: 1965/03/22, 54 y.o.   MRN: [id number]  HPI  Patient is a 54 yr old female who presents today for cpx.  Immunizations:  Needs shingrix #2, had pfizer x 2, tetanus up to date. Pneumovax up to date. Flu shot today.  Diet: "horrible" eats too much junk food Exercise: jumping jacks, leg lifts, push ups, lunges, squats Colonoscopy: 04/05/19 Dexa:  11/05/18 Pap Smear:  10/05/2018 Mammogram: due  Vision: up to date Dental: due  Hyperlipidemia- maintained on atorvastatin 10mg .   Lab Results  Component Value Date   CHOL 161 10/05/2018   HDL 39.40 10/05/2018   LDLCALC 95 10/05/2018   TRIG 136.0 10/05/2018   CHOLHDL 4 10/05/2018    Review of Systems  Constitutional: Negative for unexpected weight change.  HENT: Negative for hearing loss and rhinorrhea.   Eyes: Negative for visual disturbance.  Respiratory: Negative for cough and shortness of breath.   Cardiovascular: Negative for chest pain.  Gastrointestinal: Negative for constipation and diarrhea.  Genitourinary: Negative for dysuria, frequency and hematuria.  Musculoskeletal: Negative for arthralgias and myalgias.  Neurological: Negative for headaches.  Hematological: Negative for adenopathy.  Psychiatric/Behavioral:       Denies depression/anxiety   Past Medical History:  Diagnosis Date   Arthritis    low back pain   Chicken pox as a child   Depression with anxiety    pt denies   Diabetes mellitus without complication (HCC)    Frequent headaches    Hx: UTI (urinary tract infection)    Hyperlipidemia    Hyperthyroidism 10/08/2018   Migraines    Mumps as a child   Overweight    UTI (urinary tract infection)      Social History   Socioeconomic History   Marital status: Single    Spouse name: Not on file   Number of children: 3   Years of education: Not on file   Highest education level: Not on file  Occupational History   Occupation:  10/10/2018 at [employer]  Tobacco Use   Smoking status: Never Smoker   Smokeless tobacco: Never Used  Vaping Use   Vaping Use: Never used  Substance and Sexual Activity   Alcohol use: No   Drug use: No   Sexual activity: Not Currently    Partners: Male    Comment: lives with mother  Other Topics Concern   Not on file  Social History Narrative   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"   Single, never married   3 children (2 daughters local) Son in [place], 5 grandchildren   Lives with her mother who has a [place]   Enjoys Nurse, mental health   Social Determinants of Diplomatic Services operational officer Strain:    Difficulty of Paying Living Expenses: Not on file  Food Insecurity:    Worried About Corporate investment banker in the Last Year: Not on file   Programme researcher, broadcasting/film/video of Food in the Last Year: Not on file  Transportation Needs:    Lack of Transportation (Medical): Not on file   Lack of Transportation (Non-Medical): Not on file  Physical Activity:    Days of Exercise per Week: Not on file   Minutes of Exercise per Session: Not on file  Stress:    Feeling of Stress : Not on file  Social Connections:    Frequency of Communication with Friends and Family: Not on file  Frequency of Social Gatherings with Friends and Family: Not on file   Attends Religious Services: Not on file   Active Member of Clubs or Organizations: Not on file   Attends Banker Meetings: Not on file   Marital Status: Not on file  Intimate Partner Violence:    Fear of Current or Ex-Partner: Not on file   Emotionally Abused: Not on file   Physically Abused: Not on file   Sexually Abused: Not on file    Past Surgical History:  Procedure Laterality Date   CARDIOVERSION  12/13/2018   CESAREAN SECTION  1993   TUBAL LIGATION  1993    Family History  Problem Relation Age of Onset   Other Mother        low blood pressure   Hyperlipidemia Mother    Multiple sclerosis  Son    Stroke Maternal Grandmother    Diabetes Maternal Grandmother        type 2   Alzheimer's disease Paternal Grandfather    Colon cancer Neg Hx    Colon polyps Neg Hx    Esophageal cancer Neg Hx    Stomach cancer Neg Hx    Rectal cancer Neg Hx     Allergies  Allergen Reactions   Morphine And Related Hives    Current Outpatient Medications on File Prior to Visit  Medication Sig Dispense Refill   atorvastatin (LIPITOR) 10 MG tablet TAKE 1 TABLET(10 MG) BY MOUTH DAILY 30 tablet 3   glipiZIDE (GLUCOTROL) 10 MG tablet TAKE 1 TABLET(10 MG) BY MOUTH TWICE DAILY BEFORE A MEAL 180 tablet 2   metoprolol tartrate (LOPRESSOR) 25 MG tablet Take 1 tablet (25 mg total) by mouth 2 (two) times daily. 180 tablet 3   No current facility-administered medications on file prior to visit.    BP 118/79 (BP Location: Right Arm, Patient Position: Sitting, Cuff Size: Small)    Pulse 66    Temp 98.3 F (36.8 C) (Oral)    Resp 16    Ht 5\' 9"  (1.753 m)    Wt 199 lb 12.8 oz (90.6 kg)    LMP 04/06/2012    SpO2 99%    BMI 29.51 kg/m        Objective:   Physical Exam  Physical Exam  Constitutional: She is oriented to person, place, and time. She appears well-developed and well-nourished. No distress.  HENT:  Head: Normocephalic and atraumatic.  Right Ear: Tympanic membrane and ear canal normal.  Left Ear: Tympanic membrane and ear canal normal.  Mouth/Throat: not examined- pt wearing mask Eyes: Pupils are equal, round, and reactive to light. No scleral icterus.  Neck: Normal range of motion. No thyromegaly present.  Cardiovascular: Normal rate and regular rhythm.   No murmur heard. Pulmonary/Chest: Effort normal and breath sounds normal. No respiratory distress. He has no wheezes. She has no rales. She exhibits no tenderness.  Abdominal: Soft. Bowel sounds are normal. She exhibits no distension and no mass. There is no tenderness. There is no rebound and no guarding.  Musculoskeletal:  She exhibits no edema.  Lymphadenopathy:    She has no cervical adenopathy.  Neurological: She is alert and oriented to person, place, and time. She has normal patellar reflexes. She exhibits normal muscle tone. Coordination normal.  Skin: Skin is warm and dry.  Psychiatric: She has a normal mood and affect. Her behavior is normal. Judgment and thought content normal.  Breasts: Examined lying Right: Without masses, retractions, discharge or axillary adenopathy.  Left: Without masses, retractions, discharge or axillary adenopathy.       Assessment & Plan:   Preventative care- discussed healthy diet, exercise and weight loss. Refer for mammo. Shingrix #2 and flu shot today.  Recommended that she also get pfizer booster at her convenience.    Hyperlipidemia- tolerating atorvastatin 10mg - continue same. Update lipid panel.  This visit occurred during the SARS-CoV-2 public health emergency.  Safety protocols were in place, including screening questions prior to the visit, additional usage of staff PPE, and extensive cleaning of exam room while observing appropriate contact time as indicated for disinfecting solutions.         Assessment & Plan:

## 2019-12-02 ENCOUNTER — Other Ambulatory Visit: Payer: Self-pay | Admitting: Family

## 2020-01-01 ENCOUNTER — Other Ambulatory Visit: Payer: Self-pay

## 2020-01-01 ENCOUNTER — Telehealth: Payer: Self-pay

## 2020-01-01 MED ORDER — ATORVASTATIN CALCIUM 10 MG PO TABS: ORAL_TABLET | ORAL | 1 refills | Status: DC

## 2020-01-01 NOTE — Telephone Encounter (Signed)
Pt called stating she needs a refill on Atorvastatin 10MG  tab.  Please send to [facility] [address], [place], [place].

## 2020-01-01 NOTE — Telephone Encounter (Signed)
rx sent

## 2020-01-21 ENCOUNTER — Ambulatory Visit (INDEPENDENT_AMBULATORY_CARE_PROVIDER_SITE_OTHER): Payer: 59 | Admitting: Internal Medicine

## 2020-01-21 ENCOUNTER — Encounter: Payer: Self-pay | Admitting: Internal Medicine

## 2020-01-21 ENCOUNTER — Other Ambulatory Visit: Payer: Self-pay

## 2020-01-21 VITALS — BP 118/76 | HR 69 | Ht 69.0 in | Wt 199.2 lb

## 2020-01-21 DIAGNOSIS — E1165 Type 2 diabetes mellitus with hyperglycemia: Secondary | ICD-10-CM

## 2020-01-21 DIAGNOSIS — E0500 Thyrotoxicosis with diffuse goiter without thyrotoxic crisis or storm: Secondary | ICD-10-CM | POA: Diagnosis not present

## 2020-01-21 LAB — POCT GLYCOSYLATED HEMOGLOBIN (HGB A1C): Hemoglobin A1C: 7.9 % — AB (ref 4.0–5.6)

## 2020-01-21 LAB — TSH: TSH: 2.7 u[IU]/mL (ref 0.35–4.50)

## 2020-01-21 LAB — T4, FREE: Free T4: 0.85 ng/dL (ref 0.60–1.60)

## 2020-01-21 MED ORDER — CANAGLIFLOZIN 100 MG PO TABS: 100.0000 mg | ORAL_TABLET | Freq: Every day | ORAL | 6 refills | Status: DC

## 2020-01-21 NOTE — Progress Notes (Signed)
Name: Sarah Ibarra  Age/ Sex: 56 y.o., female   MRN/ DOB: [id number], 03-22-1965     PCP: Sandford Craze, NP   Reason for Endocrinology Evaluation: Type 2 Diabetes Mellitus  Initial Endocrine Consultative Visit: 12/12/2018    PATIENT IDENTIFIER: [name] is a 55 y.o. female with a past medical history of T2DM and Dyslipidemia. The patient has followed with Endocrinology clinic since 12/12/2018 for consultative assistance with management of her diabetes.  DIABETIC HISTORY:  [name] was diagnosed with DM in 2009, she has been on Metformin since diagnosis.Her hemoglobin A1c has ranged from 8.3% in 2019, peaking at 12.5% in 2020 On her initial visit to our clinic she had an A1c of 12.5%, she declined insulin at the time and opted for Glipizide. We stopped Metformin due to nausea     THYROID HISTORY:  Pt has been noted to have suppressed TSH < 0.01 uIU/mL with elevated FT4 3.11 ng/dL during routine workup. She was noted to have weight loss at the time of her presentation but no other symptoms. She was started on Methimazole 11/2018 TRAB level slightly elevated at 4.91 IU/L   Mother with thyroid disease  SUBJECTIVE:   During the last visit (10/22/2019): A1c 7.5 % . We continued Glipizide      Today (01/21/2020): [name] is here for a follow up on diabetes.  She checks her blood sugars 1 times daily. The patient has not had hypoglycemic episodes since the last clinic visit.  Denies weight loss Denies diarrhea and palpitations     HOME ENDOCRINE REGIMEN:  Glipizide 10 mg BID    Statin: Yes ACE-I/ARB: No    METER DOWNLOAD SUMMARY: [place]  30 day average 168 mg/dL  Low 638 High 756  SD 19    DIABETIC COMPLICATIONS: Microvascular complications:    Denies: CKD , neuropathy, retinopathy   Last Eye Exam: Completed 10/2018- scheduled 10/16th,2021  Macrovascular complications:    Denies: CAD, CVA, PVD   HISTORY:  Past Medical History:  Past  Medical History:  Diagnosis Date  . Arthritis    low back pain  . Chicken pox as a child  . Depression with anxiety    pt denies  . Diabetes mellitus without complication (HCC)   . Frequent headaches   . Hx: UTI (urinary tract infection)   . Hyperlipidemia   . Hyperthyroidism 10/08/2018  . Migraines   . Mumps as a child  . Overweight   . UTI (urinary tract infection)    Past Surgical History:  Past Surgical History:  Procedure Laterality Date  . CARDIOVERSION  12/13/2018  . CESAREAN SECTION  1993  . TUBAL LIGATION  1993    Social History:  reports that she has never smoked. She has never used smokeless tobacco. She reports that she does not drink alcohol and does not use drugs. Family History:  Family History  Problem Relation Age of Onset  . Other Mother        low blood pressure  . Hyperlipidemia Mother   . Multiple sclerosis Son   . Stroke Maternal Grandmother   . Diabetes Maternal Grandmother        type 2  . Alzheimer's disease Paternal Grandfather   . Colon cancer Neg Hx   . Colon polyps Neg Hx   . Esophageal cancer Neg Hx   . Stomach cancer Neg Hx   . Rectal cancer Neg Hx      HOME MEDICATIONS: Allergies as of 01/21/2020  Reactions   Morphine And Related Hives      Medication List       Accurate as of January 21, 2020  8:38 AM. If you have any questions, ask your nurse or doctor.        atorvastatin 10 MG tablet Commonly known as: LIPITOR TAKE 1 TABLET(10 MG) BY MOUTH DAILY   glipiZIDE 10 MG tablet Commonly known as: GLUCOTROL TAKE 1 TABLET(10 MG) BY MOUTH TWICE DAILY BEFORE A MEAL   metoprolol tartrate 25 MG tablet Commonly known as: LOPRESSOR Take 1 tablet (25 mg total) by mouth 2 (two) times daily.        OBJECTIVE:   Vital Signs: BP 118/76   Pulse 69   Ht 5\' 9"  (1.753 m)   Wt 199 lb 4 oz (90.4 kg)   LMP 04/06/2012   SpO2 98%   BMI 29.42 kg/m   Wt Readings from Last 3 Encounters:  01/21/20 199 lb 4 oz (90.4 kg)  11/20/19  199 lb 12.8 oz (90.6 kg)  10/22/19 196 lb (88.9 kg)     Exam: General: Pt appears well and is in NAD  Neck: General: Supple without adenopathy. Thyroid: Thyroid size normal.  No goiter or nodules appreciated.  Lungs: Clear with good BS bilat with no rales, rhonchi, or wheezes  Heart: RRR with normal S1 and S2 and no gallops; no murmurs; no rub  Abdomen: Normoactive bowel sounds, soft, nontender, without masses or organomegaly palpable  Extremities: No pretibial edema.   Neuro: MS is good with appropriate affect, pt is alert and Ox3    DM foot exam: 10/22/2019  The skin of the feet is without sores or ulcerations. The pedal pulses are 2+ on right and 2+ on left. The sensation is intact to a screening 5.07, 10 gram monofilament bilaterally    DATA REVIEWED:  Lab Results  Component Value Date   HGBA1C 7.9 (A) 01/21/2020   HGBA1C 7.5 (A) 10/22/2019   HGBA1C 7.2 (H) 06/19/2019   Lab Results  Component Value Date   MICROALBUR <0.7 03/19/2019   LDLCALC 95 10/05/2018   CREATININE 1.05 06/19/2019   Lab Results  Component Value Date   MICRALBCREAT 0.6 03/19/2019     Lab Results  Component Value Date   CHOL 161 10/05/2018   HDL 39.40 10/05/2018   LDLCALC 95 10/05/2018   TRIG 136.0 10/05/2018   CHOLHDL 4 10/05/2018       Results for JANEECE, BLOK (MRN Delight Stare) as of 01/21/2020 13:16  Ref. Range 01/21/2020 08:52  TSH Latest Ref Range: 0.35 - 4.50 uIU/mL 2.70  T4,Free(Direct) Latest Ref Range: 0.60 - 1.60 ng/dL 03/20/2020   ASSESSMENT / PLAN / RECOMMENDATIONS:   1) Type 2 Diabetes Mellitus, suboptimally controlled, With out complications - Most recent A1c of 7.9 %. Goal A1c <7.0%.   - A1c slightly worse  - I have again encouraged her to work on low carb diet, she has been fasting and so far working good, trying to avoid sugar-sweetened beverages  - Discussed adding an SGLT-2 inhibitors, cautioned against genital infections    MEDICATIONS: Continue glipizide 10 mg twice  daily Start Invokana 100 mg , 1 tablet daily     EDUCATION / INSTRUCTIONS:  BG monitoring instructions: Patient is instructed to check her blood sugars 2 times a day, fasting and supper.  Call [facility] if: BG persistently < 70  . I reviewed the Rule of 15 for the treatment of hypoglycemia in detail with the patient.  Literature supplied.     2) Diabetic complications:   Eye: Does night have known diabetic retinopathy.   Neuro/ Feet: Does not have known diabetic peripheral neuropathy .   Renal: Patient does not have known baseline CKD. She   is not on an ACEI/ARB at present.     3) Graves' Disease:    - She was on Methimazole for approximately a year 11/2018 to 10/2019 - No extrathyroidal manifestations of Graves' disease - In remission  - Clinically euthyroid     F/U in 3 months   Signed electronically by: Mack Guise, MD  [facility]  [facility] [address] [place], [place] [address] Phone: [phone] FAX: [phone]   CC: Debbrah Alar, NP [address] [place] [address] Phone: [phone]  Fax: [phone]  Return to Endocrinology clinic as below: Future Appointments  Date Time Provider [place]  02/12/2020  8:50 AM [facility] 3 [facility]  11/20/2020  7:00 AM Debbrah Alar, NP [facility]

## 2020-01-21 NOTE — Patient Instructions (Addendum)
-   Continue Glipizide 10 mg, 1 tablet before Breakfast and Supper  - Start Invokana 100 mg daily      HOW TO TREAT LOW BLOOD SUGARS (Blood sugar LESS THAN 70 MG/DL)  Please follow the RULE OF 15 for the treatment of hypoglycemia treatment (when your (blood sugars are less than 70 mg/dL)    STEP 1: Take 15 grams of carbohydrates when your blood sugar is low, which includes:   3-4 GLUCOSE TABS  OR  3-4 OZ OF JUICE OR REGULAR SODA OR  ONE TUBE OF GLUCOSE GEL     STEP 2: RECHECK blood sugar in 15 MINUTES STEP 3: If your blood sugar is still low at the 15 minute recheck --> then, go back to STEP 1 and treat AGAIN with another 15 grams of carbohydrates.

## 2020-01-22 ENCOUNTER — Telehealth: Payer: Self-pay | Admitting: Internal Medicine

## 2020-01-22 MED ORDER — DAPAGLIFLOZIN PROPANEDIOL 5 MG PO TABS
5.0000 mg | ORAL_TABLET | Freq: Every day | ORAL | 6 refills | Status: DC
Start: 2020-01-22 — End: 2020-04-14

## 2020-01-22 NOTE — Telephone Encounter (Signed)
Marcelline Deist prescription sent

## 2020-01-22 NOTE — Telephone Encounter (Signed)
Patient have been notified of the change.

## 2020-01-22 NOTE — Telephone Encounter (Signed)
Message left for patient to return my call.  

## 2020-01-22 NOTE — Telephone Encounter (Signed)
Prior Authorization was submitted for Invokana 100 mg to patient's insurance as requested on 01/21/2020.  Received fax that requested was denied.  According to the fax:  Per health plan's criteria, this drug is covered if patient tried or cannot use at least two drugs from the following:  (a) Farxiga (b) Jardiance  Please advise.

## 2020-02-04 ENCOUNTER — Other Ambulatory Visit: Payer: Self-pay

## 2020-02-04 MED ORDER — METOPROLOL TARTRATE 25 MG PO TABS: 25.0000 mg | ORAL_TABLET | Freq: Two times a day (BID) | ORAL | 0 refills | Status: DC

## 2020-02-12 ENCOUNTER — Other Ambulatory Visit: Payer: Self-pay

## 2020-02-12 ENCOUNTER — Ambulatory Visit
Admission: RE | Admit: 2020-02-12 | Discharge: 2020-02-12 | Disposition: A | Discharge status: Home / Self Care | Payer: 59 | Source: Ambulatory Visit | Attending: Family | Admitting: Family

## 2020-02-12 DIAGNOSIS — Z0000 Encounter for general adult medical examination without abnormal findings: Secondary | ICD-10-CM

## 2020-04-14 ENCOUNTER — Telehealth: Payer: Self-pay | Admitting: Family

## 2020-04-14 MED ORDER — DAPAGLIFLOZIN PROPANEDIOL 5 MG PO TABS: 5.0000 mg | ORAL_TABLET | Freq: Every day | ORAL | 2 refills | Status: DC

## 2020-04-14 NOTE — Telephone Encounter (Signed)
[facility] telling [facility] has no refills   dapagliflozin propanediol (FARXIGA) 5 MG TABS tablet [[id number]]    [id number] - [place], [place] - [address] AT [facility] [address]  [address], [place] [address]  Phone:  [phone] Fax:  [phone]

## 2020-04-14 NOTE — Telephone Encounter (Signed)
Re-sent Rx as well.

## 2020-04-14 NOTE — Telephone Encounter (Signed)
Its shows that we have sent #[phone] with 6 refills on 01/22/2020. However, I did received a fax from the [facility] stating Farxiga 5 mg was not cover under insurance. I have submitted prior auth and it shows it has been approved.

## 2020-05-08 ENCOUNTER — Other Ambulatory Visit: Payer: Self-pay | Admitting: Cardiology

## 2020-05-26 ENCOUNTER — Encounter: Payer: Self-pay | Admitting: Internal Medicine

## 2020-05-26 ENCOUNTER — Ambulatory Visit (INDEPENDENT_AMBULATORY_CARE_PROVIDER_SITE_OTHER): Payer: 59 | Admitting: Internal Medicine

## 2020-05-26 ENCOUNTER — Other Ambulatory Visit: Payer: Self-pay

## 2020-05-26 VITALS — BP 116/76 | HR 77 | Ht 69.0 in | Wt 195.5 lb

## 2020-05-26 DIAGNOSIS — E119 Type 2 diabetes mellitus without complications: Secondary | ICD-10-CM | POA: Diagnosis not present

## 2020-05-26 DIAGNOSIS — E785 Hyperlipidemia, unspecified: Secondary | ICD-10-CM

## 2020-05-26 DIAGNOSIS — Z8639 Personal history of other endocrine, nutritional and metabolic disease: Secondary | ICD-10-CM | POA: Diagnosis not present

## 2020-05-26 LAB — BASIC METABOLIC PANEL
BUN: 17 mg/dL (ref 6–23)
CO2: 27 mEq/L (ref 19–32)
Calcium: 9.2 mg/dL (ref 8.4–10.5)
Chloride: 105 mEq/L (ref 96–112)
Creatinine, Ser: 0.92 mg/dL (ref 0.40–1.20)
GFR: 69.29 mL/min (ref >60.00)
Glucose, Bld: 106 mg/dL — ABNORMAL HIGH (ref 70–99)
Potassium: 4 mEq/L (ref 3.5–5.1)
Sodium: 139 mEq/L (ref 135–145)

## 2020-05-26 LAB — LIPID PANEL
Cholesterol: 157 mg/dL (ref 0–200)
HDL: 47.2 mg/dL (ref >39.00)
LDL Cholesterol: 95 mg/dL (ref 0–99)
NonHDL: 109.33
Total CHOL/HDL Ratio: 3
Triglycerides: 70 mg/dL (ref 0.0–149.0)
VLDL: 14 mg/dL (ref 0.0–40.0)

## 2020-05-26 LAB — MICROALBUMIN / CREATININE URINE RATIO
Creatinine,U: 161.9 mg/dL
Microalb Creat Ratio: 0.4 mg/g (ref 0.0–30.0)
Microalb, Ur: <0.7 mg/dL (ref 0.0–1.9)

## 2020-05-26 LAB — POCT GLYCOSYLATED HEMOGLOBIN (HGB A1C): Hemoglobin A1C: 6.9 % — AB (ref 4.0–5.6)

## 2020-05-26 MED ORDER — DAPAGLIFLOZIN PROPANEDIOL 10 MG PO TABS: 10.0000 mg | ORAL_TABLET | Freq: Every day | ORAL | 3 refills | Status: DC

## 2020-05-26 NOTE — Progress Notes (Signed)
Name: Sarah Ibarra  Age/ Sex: 55 y.o., female   MRN/ DOB: [id number], [birth year]     PCP: Sarah Craze, NP   Reason for Endocrinology Evaluation: Type 2 Diabetes Mellitus  Initial Endocrine Consultative Visit: 12/12/2018    PATIENT IDENTIFIER: [name] is a 55 y.o. female with a past medical history of T2DM and Dyslipidemia. The patient has followed with Endocrinology clinic since 12/12/2018 for consultative assistance with management of her diabetes.  DIABETIC HISTORY:  [name] was diagnosed with DM in 2009, she has been on Metformin since diagnosis.Her hemoglobin A1c has ranged from 8.3% in 2019, peaking at 12.5% in 2020 On her initial visit to our clinic she had an A1c of 12.5%, she declined insulin at the time and opted for Glipizide. We stopped Metformin due to nausea   Farxiga started 01/2020   THYROID HISTORY:  Pt has been noted to have suppressed TSH < 0.01 uIU/mL with elevated FT4 3.11 ng/dL during routine workup. She was noted to have weight loss at the time of her presentation but no other symptoms. She was started on Methimazole 11/2018 TRAB level slightly elevated at 4.91 IU/L   Sarah Ibarra with thyroid disease  SUBJECTIVE:   During the last visit (01/21/2020): A1c 7.9 % . We continued Glipizide and started Sarah Ibarra      Today (05/26/2020): [name] is here for a follow up on diabetes.  She checks her blood sugars 1 times daily. The patient has not had hypoglycemic episodes since the last clinic visit.  Denies nausea or  Diarrhea Denies palpitations      HOME ENDOCRINE REGIMEN:  Glipizide 10 mg BID  Farxiga 5 mg daily      Statin: Yes ACE-I/ARB: No    METER DOWNLOAD SUMMARY: [place]  90 day average 132 mg/dL  Low 128 High 786   DIABETIC COMPLICATIONS: Microvascular complications:    Denies: CKD , neuropathy, retinopathy   Last Eye Exam: Completed  10/18/2019  Macrovascular complications:    Denies: CAD, CVA,  PVD   HISTORY:  Past Medical History:  Past Medical History:  Diagnosis Date  . Arthritis    low back pain  . Chicken pox as a child  . Depression with anxiety    pt denies  . Diabetes mellitus without complication (HCC)   . Frequent headaches   . Hx: UTI (urinary tract infection)   . Hyperlipidemia   . Hyperthyroidism 10/08/2018  . Migraines   . Mumps as a child  . Overweight   . UTI (urinary tract infection)    Past Surgical History:  Past Surgical History:  Procedure Laterality Date  . CARDIOVERSION  12/13/2018  . CESAREAN SECTION  1993  . TUBAL LIGATION  1993    Social History:  reports that she has never smoked. She has never used smokeless tobacco. She reports that she does not drink alcohol and does not use drugs. Family History:  Family History  Problem Relation Age of Onset  . Other Sarah Ibarra        low blood pressure  . Hyperlipidemia Sarah Ibarra   . Multiple sclerosis Son   . Stroke Maternal Grandmother   . Diabetes Maternal Grandmother        type 2  . Alzheimer's disease Paternal Grandfather   . Colon cancer Neg Hx   . Colon polyps Neg Hx   . Esophageal cancer Neg Hx   . Stomach cancer Neg Hx   . Rectal cancer Neg Hx  HOME MEDICATIONS: Allergies as of 05/26/2020      Reactions   Morphine And Related Hives      Medication List       Accurate as of May 26, 2020 10:21 AM. If you have any questions, ask your nurse or doctor.        atorvastatin 10 MG tablet Commonly known as: LIPITOR TAKE 1 TABLET(10 MG) BY MOUTH DAILY   dapagliflozin propanediol 5 MG Tabs tablet Commonly known as: Farxiga Take 1 tablet (5 mg total) by mouth daily before breakfast.   glipiZIDE 10 MG tablet Commonly known as: GLUCOTROL TAKE 1 TABLET(10 MG) BY MOUTH TWICE DAILY BEFORE A MEAL   metoprolol tartrate 25 MG tablet Commonly known as: LOPRESSOR Take 1 tablet (25 mg total) by mouth 2 (two) times daily. Please make overdue appt with Dr. Mayford Knife before anymore  refills. Thank you 2nd attempt        OBJECTIVE:   Vital Signs: BP 116/76   Pulse 77   Ht 5\' 9"  (1.753 m)   Wt 195 lb 8 oz (88.7 kg)   LMP 04/06/2012   SpO2 98%   BMI 28.87 kg/m   Wt Readings from Last 3 Encounters:  05/26/20 195 lb 8 oz (88.7 kg)  01/21/20 199 lb 4 oz (90.4 kg)  11/20/19 199 lb 12.8 oz (90.6 kg)     Exam: General: Pt appears well and is in NAD  Neck: General: Supple without adenopathy. Thyroid: Thyroid size normal.  No goiter or nodules appreciated.  Lungs: Clear with good BS bilat with no rales, rhonchi, or wheezes  Heart: RRR with normal S1 and S2 and no gallops; no murmurs; no rub  Abdomen: Normoactive bowel sounds, soft, nontender, without masses or organomegaly palpable  Extremities: No pretibial edema.   Neuro: MS is good with appropriate affect, pt is alert and Ox3    DM foot exam: 10/22/2019  The skin of the feet is without sores or ulcerations. The pedal pulses are 2+ on right and 2+ on left. The sensation is intact to a screening 5.07, 10 gram monofilament bilaterally    DATA REVIEWED:  Lab Results  Component Value Date   HGBA1C 6.9 (A) 05/26/2020   HGBA1C 7.9 (A) 01/21/2020   HGBA1C 7.5 (A) 10/22/2019   Results for Sarah, Ibarra (MRN Delight Stare) as of 05/28/2020 07:42  Ref. Range 05/26/2020 13:03  Sodium Latest Ref Range: 135 - 145 mEq/L 139  Potassium Latest Ref Range: 3.5 - 5.1 mEq/L 4.0  Chloride Latest Ref Range: 96 - 112 mEq/L 105  CO2 Latest Ref Range: 19 - 32 mEq/L 27  Glucose Latest Ref Range: 70 - 99 mg/dL 07/26/2020 (H)  BUN Latest Ref Range: 6 - 23 mg/dL 17  Creatinine Latest Ref Range: 0.40 - 1.20 mg/dL 676  Calcium Latest Ref Range: 8.4 - 10.5 mg/dL 9.2  GFR Latest Ref Range: >60.00 mL/min 69.29  Total CHOL/HDL Ratio Unknown 3  Cholesterol Latest Ref Range: 0 - 200 mg/dL 1.95  HDL Cholesterol Latest Ref Range: >39.00 mg/dL 093  LDL (calc) Latest Ref Range: 0 - 99 mg/dL 95  MICROALB/CREAT RATIO Latest Ref Range: 0.0 -  30.0 mg/g 0.4  NonHDL Unknown 109.33  Triglycerides Latest Ref Range: 0.0 - 149.0 mg/dL 26.71  VLDL Latest Ref Range: 0.0 - 40.0 mg/dL 24.5  Creatinine,U Latest Units: mg/dL 80.9  Microalb, Ur Latest Ref Range: 0.0 - 1.9 mg/dL 983.3   Results for Sarah Ibarra, Sarah Ibarra (MRN Delight Stare) as of 01/21/2020 13:16  Ref.  Range 01/21/2020 08:52  TSH Latest Ref Range: 0.35 - 4.50 uIU/mL 2.70  T4,Free(Direct) Latest Ref Range: 0.60 - 1.60 ng/dL 3.53   ASSESSMENT / PLAN / RECOMMENDATIONS:   1) Type 2 Diabetes Mellitus, Optimally controlled, With out complications - Most recent A1c of 6.9 %. Goal A1c <7.0%.  - Praised pt on optimizing glycemic control  - Will increase Farxiga as below  - pt to contact us with hypoglycemia or tight Bg's as it will be time to reduce Glipizide  -BMP is normal today    MEDICATIONS: Continue glipizide 10 mg twice daily Increase Farxiga to 10 mg , 1 tablet daily     EDUCATION / INSTRUCTIONS:  BG monitoring instructions: Patient is instructed to check her blood sugars 2 times a day, fasting and supper.  Call [facility] if: BG persistently < 70  . I reviewed the Rule of 15 for the treatment of hypoglycemia in detail with the patient. Literature supplied.     2) Diabetic complications:   Eye: Does night have known diabetic retinopathy.   Neuro/ Feet: Does not have known diabetic peripheral neuropathy .   Renal: Patient does not have known baseline CKD. She   is not on an ACEI/ARB at present.  Normal MA/CR ratio.    3) Graves' Disease:    - She was on Methimazole for approximately a year 11/2018 to 10/2019 - No extrathyroidal manifestations of Graves' disease - In remission  - Clinically euthyroid   4) Dyslipidemia :   -Repeat labs today show acceptable LDL and TG.  Medication Continue atorvastatin 10 mg daily   F/U in 6 months   Signed electronically by: Lyndle Herrlich, MD  [facility]  [facility] [place] [facility] [address] [place] [place], [place] [address] Phone: [phone] FAX: [phone]   CC: Sarah Craze, NP [address] [place] [address] Phone: [phone]  Fax: [phone]  Return to Endocrinology clinic as below: Future Appointments  Date Time Provider Department Center  11/20/2020  7:00 AM Sarah Craze, NP [id number]

## 2020-05-26 NOTE — Patient Instructions (Addendum)
-   Keep up the good Work ! - Continue Glipizide 10 mg, 1 tablet before Breakfast and Supper  - Increase farxiga to 10 mg daily      HOW TO TREAT LOW BLOOD SUGARS (Blood sugar LESS THAN 70 MG/DL)  Please follow the RULE OF 15 for the treatment of hypoglycemia treatment (when your (blood sugars are less than 70 mg/dL)    STEP 1: Take 15 grams of carbohydrates when your blood sugar is low, which includes:   3-4 GLUCOSE TABS  OR  3-4 OZ OF JUICE OR REGULAR SODA OR  ONE TUBE OF GLUCOSE GEL     STEP 2: RECHECK blood sugar in 15 MINUTES STEP 3: If your blood sugar is still low at the 15 minute recheck --> then, go back to STEP 1 and treat AGAIN with another 15 grams of carbohydrates.

## 2020-05-29 ENCOUNTER — Other Ambulatory Visit: Payer: Self-pay

## 2020-05-29 ENCOUNTER — Ambulatory Visit (INDEPENDENT_AMBULATORY_CARE_PROVIDER_SITE_OTHER): Payer: 59 | Admitting: Cardiology

## 2020-05-29 ENCOUNTER — Encounter: Payer: Self-pay | Admitting: Cardiology

## 2020-05-29 VITALS — BP 116/74 | HR 64 | Ht 69.0 in | Wt 197.4 lb

## 2020-05-29 DIAGNOSIS — E0590 Thyrotoxicosis, unspecified without thyrotoxic crisis or storm: Secondary | ICD-10-CM

## 2020-05-29 DIAGNOSIS — I480 Paroxysmal atrial fibrillation: Secondary | ICD-10-CM | POA: Diagnosis not present

## 2020-05-29 DIAGNOSIS — E7800 Pure hypercholesterolemia, unspecified: Secondary | ICD-10-CM

## 2020-05-29 MED ORDER — METOPROLOL TARTRATE 25 MG PO TABS: 25.0000 mg | ORAL_TABLET | Freq: Two times a day (BID) | ORAL | 3 refills | Status: DC

## 2020-05-29 MED ORDER — ATORVASTATIN CALCIUM 10 MG PO TABS: ORAL_TABLET | ORAL | 3 refills | Status: DC

## 2020-05-29 NOTE — Addendum Note (Signed)
Addended by: Theresia Majors on: 05/29/2020 11:18 AM   Modules accepted: Orders

## 2020-05-29 NOTE — Patient Instructions (Signed)

## 2020-05-29 NOTE — Progress Notes (Signed)
Cardiology Office Note:    Date:  05/29/2020   ID:  Sarah Ibarra, DOB 30-Oct-1965, MRN [id number]  PCP:  Sandford Craze, NP  Cardiologist:  Armanda Magic, MD    Referring MD: Sandford Craze, NP   Chief Complaint  Patient presents with  . Atrial Fibrillation  . Hyperlipidemia    History of Present Illness:    Sarah Ibarra is a 55 y.o. female with a hx of hyperlipidemia, PAFand anxiety.  She had new onset PAF in 11/2018 in setting of hyperthyroidism.  Rx'd with Cardizem and DCCV but reverted back to afib.  Started on BB and anticoagulation with Eliquis 5mg  BID for CHADS2-VASC score of 2.  She has maintained NSR since then.  2D echo showed normal LVF.    She is here today for followupof her PAF and HLD and is doing well.  She denies any chest pain or pressure, SOB, DOE, PND, orthopnea, LE edema, dizziness, palpitations or syncope. She is compliant with her meds and is tolerating meds with no SE.    Past Medical History:  Diagnosis Date  . Arthritis    low back pain  . Chicken pox as a child  . Depression with anxiety    pt denies  . Diabetes mellitus without complication (HCC)   . Frequent headaches   . Hx: UTI (urinary tract infection)   . Hyperlipidemia   . Hyperthyroidism 10/08/2018  . Migraines   . Mumps as a child  . Overweight   . UTI (urinary tract infection)     Past Surgical History:  Procedure Laterality Date  . CARDIOVERSION  12/13/2018  . CESAREAN SECTION  1993  . TUBAL LIGATION  1993    Current Medications: Current Meds  Medication Sig  . atorvastatin (LIPITOR) 10 MG tablet TAKE 1 TABLET(10 MG) BY MOUTH DAILY  . dapagliflozin propanediol (FARXIGA) 10 MG TABS tablet Take 1 tablet (10 mg total) by mouth daily.  12/15/2018 glipiZIDE (GLUCOTROL) 10 MG tablet TAKE 1 TABLET(10 MG) BY MOUTH TWICE DAILY BEFORE A MEAL  . metoprolol tartrate (LOPRESSOR) 25 MG tablet Take 1 tablet (25 mg total) by mouth 2 (two) times daily. Please make overdue appt with Dr.  Marland Kitchen before anymore refills. Thank you 2nd attempt     Allergies:   Morphine and related   Social History   Socioeconomic History  . Marital status: Single    Spouse name: Not on file  . Number of children: 3  . Years of education: Not on file  . Highest education level: Not on file  Occupational History  . Occupation: [employer] at [employer]  Tobacco Use  . Smoking status: Never Smoker  . Smokeless tobacco: Never Used  Vaping Use  . Vaping Use: Never used  Substance and Sexual Activity  . Alcohol use: No  . Drug use: No  . Sexual activity: Not Currently    Partners: Male    Comment: lives with mother  Other Topics Concern  . Not on file  Social History Narrative   Works at [employer] center as a [employer], never married   3 children (2 daughters local) Son in [place], 5 grandchildren   Lives with her mother who has a [place]   Enjoys Nurse, mental health   Social Determinants of Diplomatic Services operational officer Strain: Not on file  Food Insecurity: Not on file  Transportation Needs: Not on file  Physical Activity: Not on file  Stress: Not on file  Social  Connections: Not on file     Family History: The patient's family history includes Alzheimer's disease in her paternal grandfather; Diabetes in her maternal grandmother; Hyperlipidemia in her mother; Multiple sclerosis in her son; Other in her mother; Stroke in her maternal grandmother. There is no history of Colon cancer, Colon polyps, Esophageal cancer, Stomach cancer, or Rectal cancer.  ROS:   Please see the history of present illness.    ROS  All other systems reviewed and negative.   EKGs/Labs/Other Studies Reviewed:    The following studies were reviewed today:   EKG:  EKG is ordered today and demonstrates NSR with no ST changes Recent Labs: 01/21/2020: TSH 2.70 05/26/2020: BUN 17; Creatinine, Ser 0.92; Potassium 4.0; Sodium 139   Recent Lipid Panel    Component Value Date/Time   CHOL  157 05/26/2020 1303   TRIG 70.0 05/26/2020 1303   HDL 47.20 05/26/2020 1303   CHOLHDL 3 05/26/2020 1303   VLDL 14.0 05/26/2020 1303   LDLCALC 95 05/26/2020 1303    Physical Exam:    VS:  BP 116/74   Pulse 64   Ht 5\' 9"  (1.753 m)   Wt 197 lb 6.4 oz (89.5 kg)   LMP 04/06/2012   BMI 29.15 kg/m     Wt Readings from Last 3 Encounters:  05/29/20 197 lb 6.4 oz (89.5 kg)  05/26/20 195 lb 8 oz (88.7 kg)  01/21/20 199 lb 4 oz (90.4 kg)     GEN: Well nourished, well developed in no acute distress HEENT: Normal NECK: No JVD; No carotid bruits LYMPHATICS: No lymphadenopathy CARDIAC:RRR, no murmurs, rubs, gallops RESPIRATORY:  Clear to auscultation without rales, wheezing or rhonchi  ABDOMEN: Soft, non-tender, non-distended MUSCULOSKELETAL:  No edema; No deformity  SKIN: Warm and dry NEUROLOGIC:  Alert and oriented x 3 PSYCHIATRIC:  Normal affect     ASSESSMENT:    1. PAF (paroxysmal atrial fibrillation) (HCC)   2. Hyperthyroidism   3. Pure hypercholesterolemia    PLAN:    In order of problems listed above:  1.  PAF -she continues to maintain NSR on exam with no palpitations -continue medical management with Lopressor 25mg  BID>>I have refilled this for a year -although her CHADS2VASC score is 2 (female and DM) her PAF occurred in setting of hyperthyroidism and therefore she is now off anticoagulation.    2.  Hyperthyroidism -per endocrine -this has resolved  3.  HLD -LDL goal < 100 -labs reviewed from PCP on 05/26/2020 and showed an LDL of 95 -continue prescription drug management with atorvastatin 10mg  daily>>I have refilled this for a year  followup with me in 1 year   Medication Adjustments/Labs and Tests Ordered: Current medicines are reviewed at length with the patient today.  Concerns regarding medicines are outlined above.  No orders of the defined types were placed in this encounter.  No orders of the defined types were placed in this  encounter.   Signed, , MD  05/29/2020 11:12 AM    [facility]

## 2020-07-14 ENCOUNTER — Other Ambulatory Visit: Payer: Self-pay | Admitting: Internal Medicine

## 2020-07-23 ENCOUNTER — Other Ambulatory Visit: Payer: Self-pay

## 2020-07-23 MED ORDER — METOPROLOL TARTRATE 25 MG PO TABS: 25.0000 mg | ORAL_TABLET | Freq: Two times a day (BID) | ORAL | 3 refills | Status: DC

## 2020-11-05 LAB — HM DIABETES EYE EXAM

## 2020-11-10 IMAGING — MG DIGITAL SCREENING BILAT W/ TOMO W/ CAD
8 series · 8 of 24 positions shown · non-contrast
Comparison: None

CLINICAL DATA: Screening.

EXAM:
DIGITAL SCREENING BILATERAL MAMMOGRAM WITH TOMO AND CAD

[R CC synth-2D]
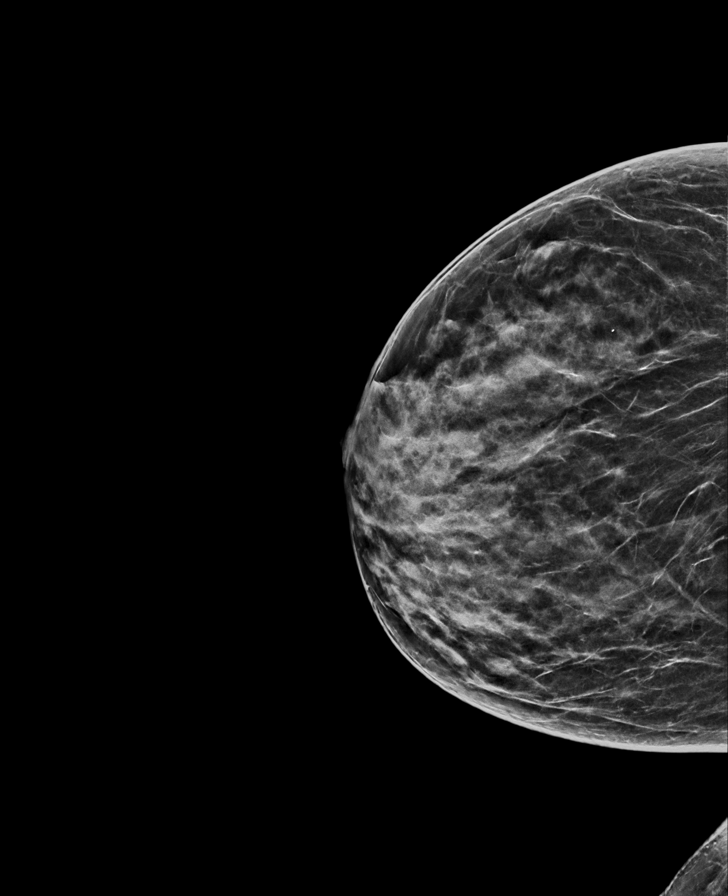

[R MLO synth-2D]
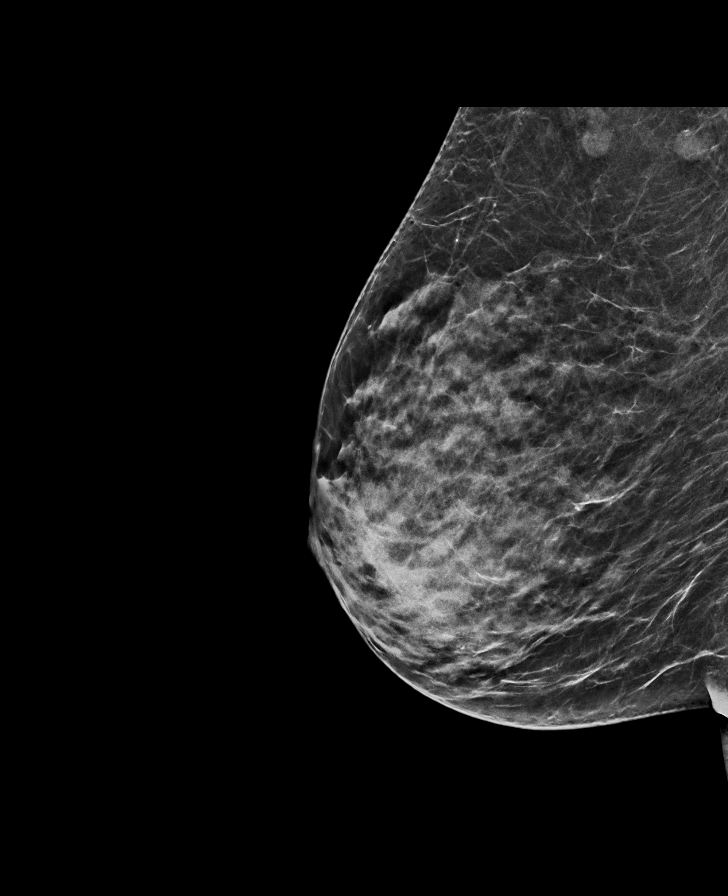

[L MLO synth-2D]
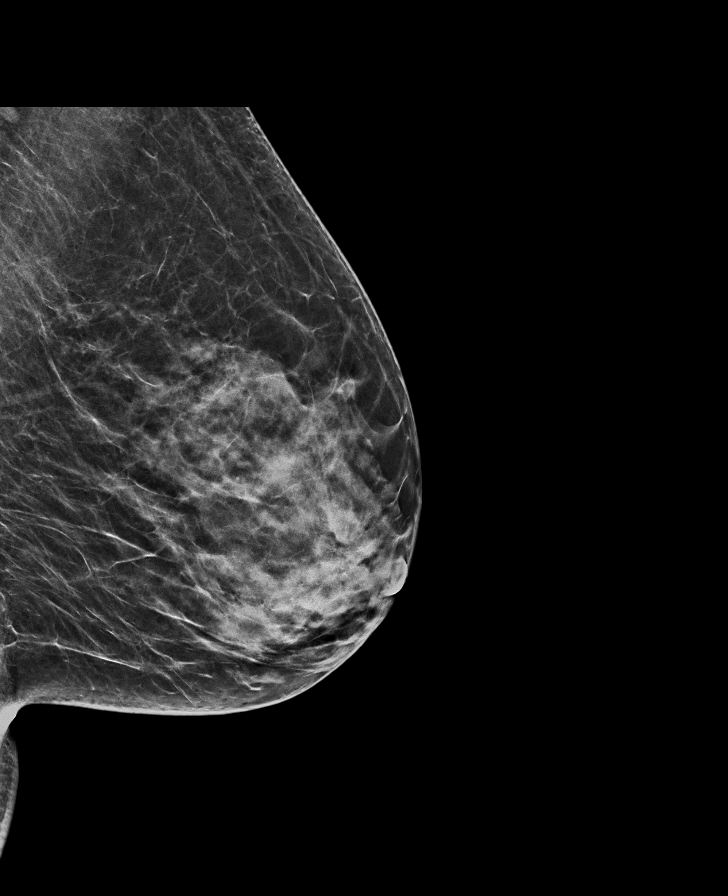

[L CC synth-2D]
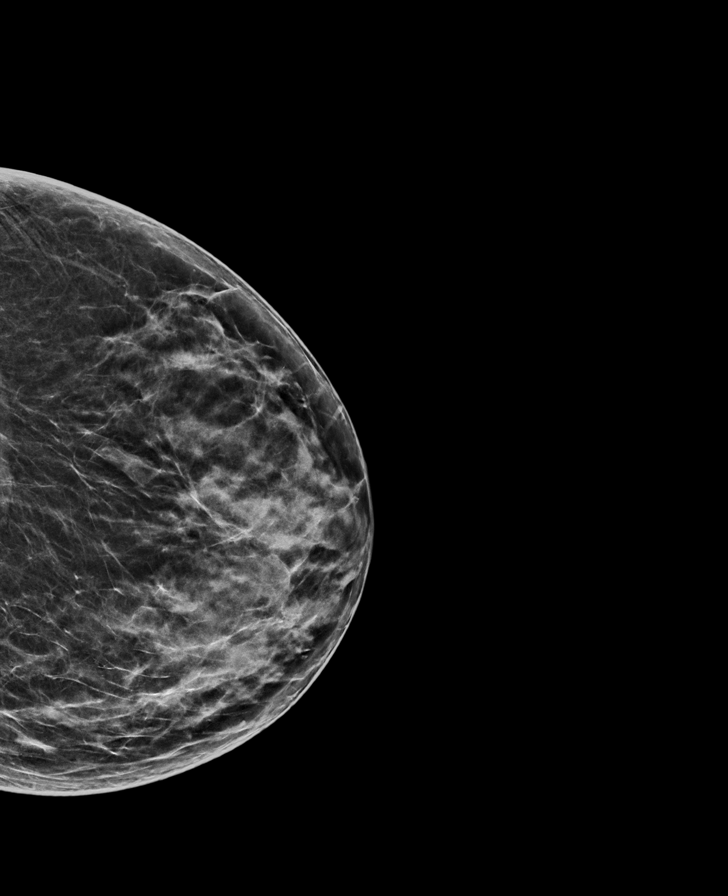

[R MLO tomo · tomo slice 29/58.0]
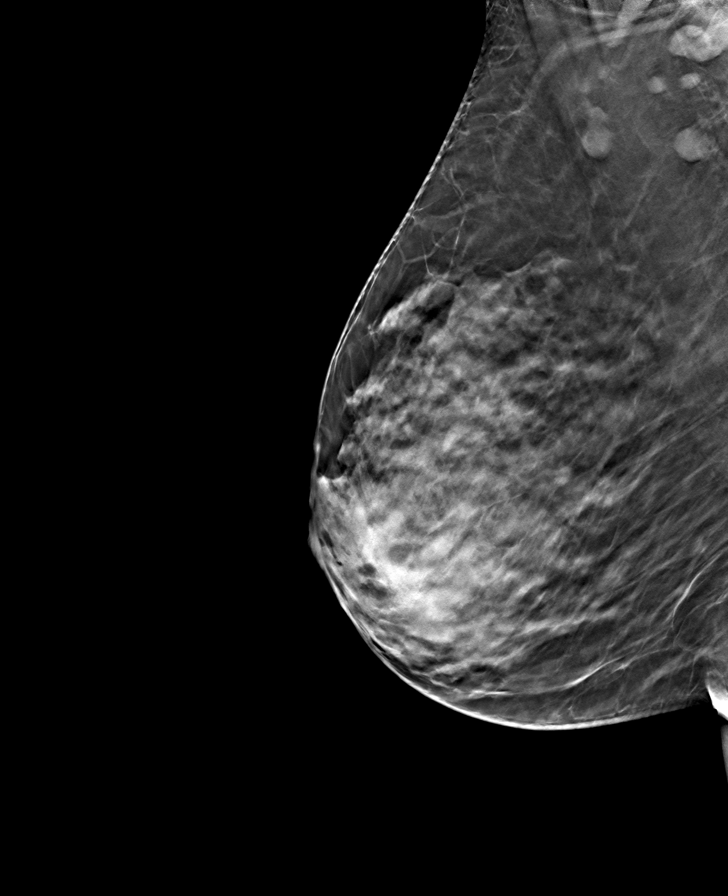

[L MLO tomo · tomo slice 29/58.0]
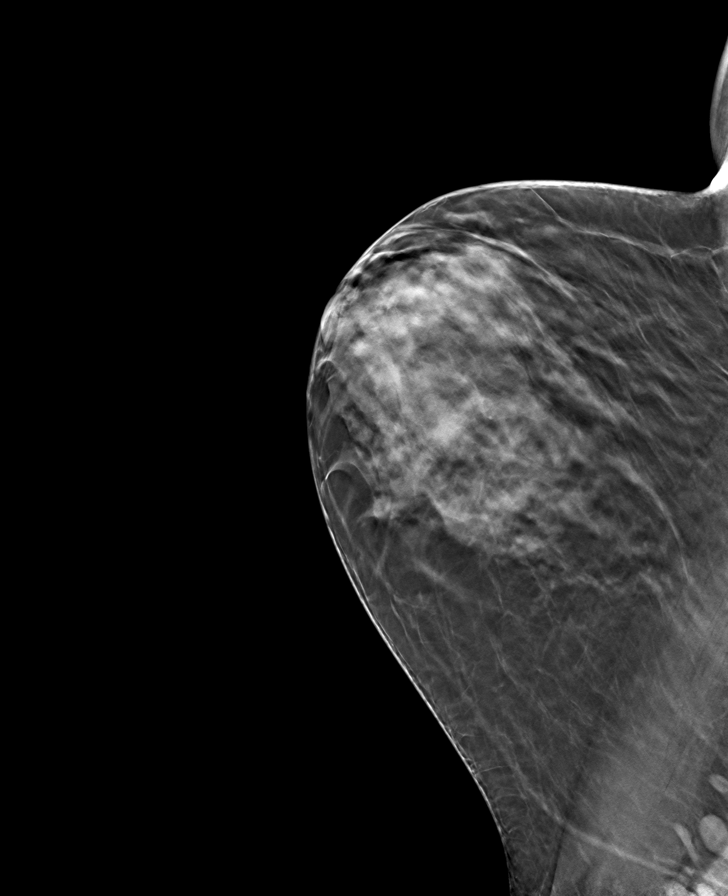

[R CC tomo · tomo slice 29/56.0]
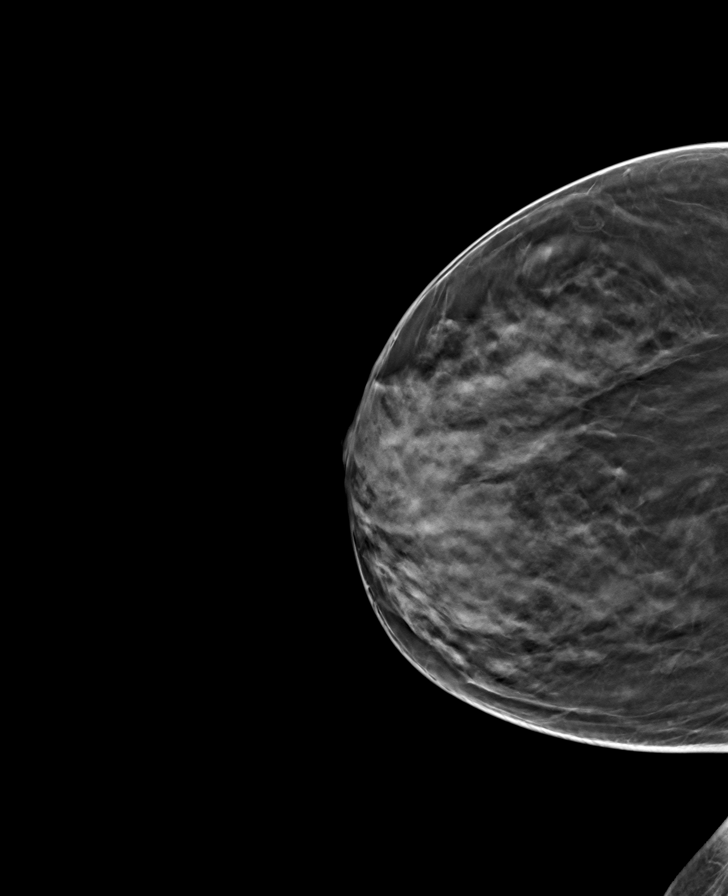

[L CC tomo · tomo slice 31/61.0]
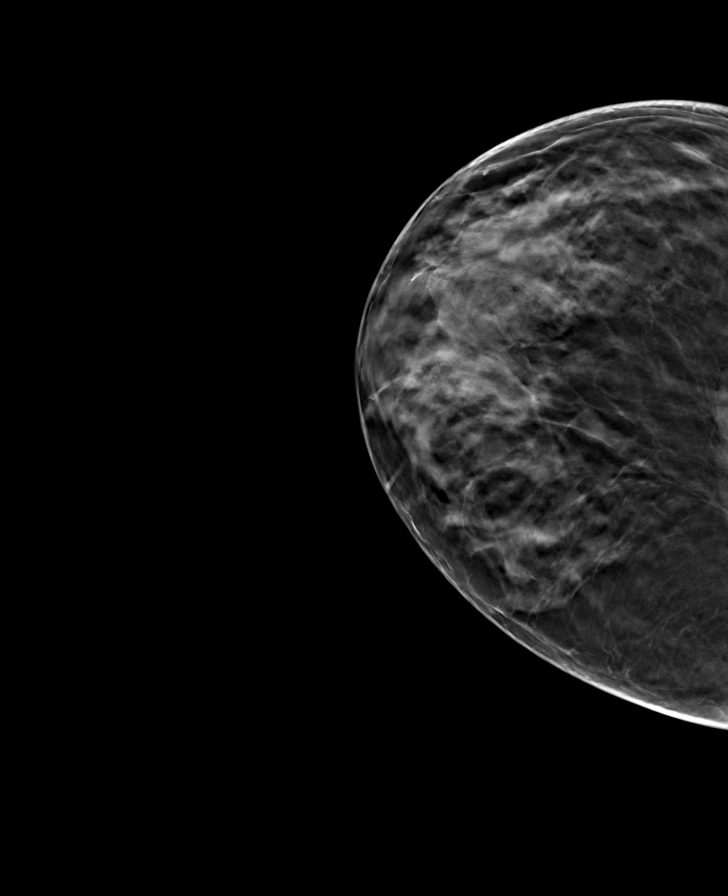

[8 of 24 positions shown; findings below may reference images not displayed]

ACR Breast Density Category c: The breast tissue is heterogeneously
dense, which may obscure small masses.
FINDINGS: In the right breast, a possible mass warrants further evaluation. In
the left breast, no findings suspicious for malignancy. Images were
processed with CAD.
IMPRESSION: Further evaluation is suggested for possible mass in the right
breast.

RECOMMENDATION:
Diagnostic mammogram and possibly ultrasound of the right breast.
(Code:[id number])

The patient will be contacted regarding the findings, and additional
imaging will be scheduled.

BI-RADS CATEGORY  0: Incomplete. Need additional imaging evaluation
and/or prior mammograms for comparison.

## 2020-11-19 ENCOUNTER — Other Ambulatory Visit: Payer: Self-pay

## 2020-11-20 ENCOUNTER — Encounter: Payer: Self-pay | Admitting: Family

## 2020-11-20 ENCOUNTER — Ambulatory Visit (INDEPENDENT_AMBULATORY_CARE_PROVIDER_SITE_OTHER): Payer: 59 | Admitting: Family

## 2020-11-20 VITALS — BP 104/60 | HR 66 | Temp 98.3°F | Resp 16 | Ht 69.0 in | Wt 198.0 lb

## 2020-11-20 DIAGNOSIS — E119 Type 2 diabetes mellitus without complications: Secondary | ICD-10-CM

## 2020-11-20 DIAGNOSIS — Z0000 Encounter for general adult medical examination without abnormal findings: Secondary | ICD-10-CM | POA: Diagnosis not present

## 2020-11-20 DIAGNOSIS — E010 Iodine-deficiency related diffuse (endemic) goiter: Secondary | ICD-10-CM

## 2020-11-20 DIAGNOSIS — Z23 Encounter for immunization: Secondary | ICD-10-CM

## 2020-11-20 DIAGNOSIS — E7800 Pure hypercholesterolemia, unspecified: Secondary | ICD-10-CM

## 2020-11-20 LAB — T3, FREE: T3, Free: 3 pg/mL (ref 2.3–4.2)

## 2020-11-20 LAB — BASIC METABOLIC PANEL
BUN: 14 mg/dL (ref 6–23)
CO2: 29 mEq/L (ref 19–32)
Calcium: 9.2 mg/dL (ref 8.4–10.5)
Chloride: 104 mEq/L (ref 96–112)
Creatinine, Ser: 0.97 mg/dL (ref 0.40–1.20)
GFR: 64.81 mL/min (ref >60.00)
Glucose, Bld: 105 mg/dL — ABNORMAL HIGH (ref 70–99)
Potassium: 3.8 mEq/L (ref 3.5–5.1)
Sodium: 141 mEq/L (ref 135–145)

## 2020-11-20 LAB — T4, FREE: Free T4: 0.98 ng/dL (ref 0.60–1.60)

## 2020-11-20 LAB — TSH: TSH: 3.13 u[IU]/mL (ref 0.35–5.50)

## 2020-11-20 NOTE — Progress Notes (Signed)
Subjective:   By signing my name below, I, Lyric Barr-McArthur, attest that this documentation has been prepared under the direction and in the presence of Debbrah Alar, NP, 11/20/2020   Patient ID: Sarah Ibarra, female    DOB: 1966/01/10, 55 y.o.   MRN: [id number]  Chief Complaint  Patient presents with   Annual Exam    HPI Patient is in today for a comprehensive physical exam.  She denies any fever, unexpected weight change, adenopathy, rash, hearing loss, ear pain, rhinorrhea, visual disturbances, eye pain, chest pain, leg swelling, cough, nausea, vomitting, diarrhea, blood in stool, dysuria, frequency, myalgias, arthralgias, depression or anxiety.  Blood sugar: She has been working with another provider on controlling her blood sugar levels. She notes she has had a few high readings here and there but mentions it is due to things she eats so she is limiting that. During her last visit her A1C had improved. Lab Results  Component Value Date   HGBA1C 6.9 (A) 05/26/2020  Cholesterol: During her last visit her cholesterol and other labs were within good range.  Lab Results  Component Value Date   CHOL 157 05/26/2020   HDL 47.20 05/26/2020   LDLCALC 95 05/26/2020   TRIG 70.0 05/26/2020   CHOLHDL 3 05/26/2020  Headaches: She notes headaches but denies them being frequent. She notices a correlation between her headaches and her diet so she knows how to prevent them.  Immunizations: She is going to receive her flu shot and pneumonia booster in the office today. She has already received both of her shingles shots. She has gotten 2 Covid-19 vaccines at this time. Her tetanus shot is UTD at this time.  Diet: She notes that headaches and high blood sugar reading levels are correlated with an unhealthy diet so she is more aware of what she eats to prevent these things.  Exercise: She notes that she moderately participates in exercise but could do better.  Colonoscopy:Last performed on  04/05/2019 and results found moderate non-bleeding internal hemorrhoids. Everything else appeared normal. Repeat in 10 years. Pap Smear: Last performed on 10/05/2018 and results were normal. Repeat in 3 years.  Mammogram: Last performed on 02/12/2020 and results were normal. Repeat in 1 year.  Dental: She is not UTD on dental care.  Vision: She is UTD on vision care.  Alcohol: She does not drink alcohol.  Drugs: She does not use any drugs.  Tobacco: She does not use any tobacco products.  Shx: She notes no new surgeries in the last year.  FMHx: She does not mention any changes to her family medical history at this time.     Health Maintenance Due  Topic Date Due   Pneumococcal Vaccine 19-31 Years old (2 - PCV) 05/31/2018   COVID-19 Vaccine (3 - Booster for Pfizer series) 07/04/2019   INFLUENZA VACCINE  08/17/2020   OPHTHALMOLOGY EXAM  10/31/2020    Past Medical History:  Diagnosis Date   Arthritis    low back pain   Chicken pox as a child   Depression with anxiety    pt denies   Diabetes mellitus without complication ([place])    Frequent headaches    Hx: UTI (urinary tract infection)    Hyperlipidemia    Hyperthyroidism 10/08/2018   Migraines    Mumps as a child   Overweight    UTI (urinary tract infection)     Past Surgical History:  Procedure Laterality Date   CARDIOVERSION  12/13/2018   CESAREAN SECTION  1993   TUBAL LIGATION  1993    Family History  Problem Relation Age of Onset   Other Mother        low blood pressure   Hyperlipidemia Mother    Multiple sclerosis Son    Stroke Maternal Grandmother    Diabetes Maternal Grandmother        type 2   Alzheimer's disease Paternal Grandfather    Colon cancer Neg Hx    Colon polyps Neg Hx    Esophageal cancer Neg Hx    Stomach cancer Neg Hx    Rectal cancer Neg Hx     Social History   Socioeconomic History   Marital status: Single    Spouse name: Not on file   Number of children: 3   Years of education:  Not on file   Highest education level: Not on file  Occupational History   Occupation: Catering manager at [employer]  Tobacco Use   Smoking status: Never   Smokeless tobacco: Never  Vaping Use   Vaping Use: Never used  Substance and Sexual Activity   Alcohol use: No   Drug use: No   Sexual activity: Not Currently    Partners: Male    Comment: lives with mother  Other Topics Concern   Not on file  Social History Narrative   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"   Single, never married   3 children (2 daughters local) Son in [place], 5 grandchildren   Lives with her mother who has a Nurse, mental health   Enjoys Artist Determinants of Corporate investment banker Strain: Not on file  Food Insecurity: Not on file  Transportation Needs: Not on file  Physical Activity: Not on file  Stress: Not on file  Social Connections: Not on file  Intimate Partner Violence: Not on file    Outpatient Medications Prior to Visit  Medication Sig Dispense Refill   atorvastatin (LIPITOR) 10 MG tablet TAKE 1 TABLET(10 MG) BY MOUTH DAILY 90 tablet 3   dapagliflozin propanediol (FARXIGA) 10 MG TABS tablet Take 1 tablet (10 mg total) by mouth daily. 90 tablet 3   glipiZIDE (GLUCOTROL) 10 MG tablet TAKE 1 TABLET(10 MG) BY MOUTH TWICE DAILY BEFORE A MEAL 180 tablet 2   metoprolol tartrate (LOPRESSOR) 25 MG tablet Take 1 tablet (25 mg total) by mouth 2 (two) times daily. 90 tablet 3   No facility-administered medications prior to visit.    Allergies  Allergen Reactions   Morphine And Related Hives    Review of Systems  Constitutional:  Negative for fever.       (-) unexpected weight changes (-) adenopathy  HENT:  Negative for ear pain and hearing loss.        (-) rhinorrhea   Eyes:  Negative for pain.       (-) visual disturbances  Respiratory:  Negative for cough.   Cardiovascular:  Negative for chest pain and leg swelling.  Gastrointestinal:  Negative for blood in stool,  diarrhea, nausea and vomiting.  Genitourinary:  Negative for dysuria and frequency.  Musculoskeletal:  Negative for joint pain and myalgias.  Skin:  Negative for rash.  Neurological:  Positive for headaches (With poor diet).  Psychiatric/Behavioral:  Negative for depression. The patient is not nervous/anxious.       Objective:    Physical Exam Exam conducted with a chaperone present.  Constitutional:      General: She is not in  acute distress.    Appearance: Normal appearance. She is not ill-appearing.  HENT:     Head: Normocephalic and atraumatic.     Right Ear: Tympanic membrane, ear canal and external ear normal.     Left Ear: Tympanic membrane, ear canal and external ear normal.  Eyes:     Extraocular Movements: Extraocular movements intact.     Pupils: Pupils are equal, round, and reactive to light.     Comments: (-) nystagmus  Neck:     Comments: (+) mild thyromegaly with left greater than right.  Cardiovascular:     Rate and Rhythm: Normal rate and regular rhythm.     Pulses:          Dorsalis pedis pulses are 2+ on the right side and 2+ on the left side.       Posterior tibial pulses are 2+ on the right side and 2+ on the left side.     Heart sounds: Normal heart sounds. No murmur heard.   No gallop.  Pulmonary:     Effort: Pulmonary effort is normal. No respiratory distress.     Breath sounds: Normal breath sounds. No wheezing or rales.  Chest:  Breasts:    Right: Normal.     Left: Normal.  Abdominal:     General: Bowel sounds are normal.     Palpations: Abdomen is soft.     Tenderness: There is no abdominal tenderness.  Musculoskeletal:     Comments: (+) 5/5 upper and lower extremity strength  Feet:     Comments: Diabetic Foot Exam - Simple   Simple Foot Form Diabetic Foot exam was performed with the following findings: Yes  11/20/2020  7:31 AM  Visual Inspection No deformities, no ulcerations, no other skin breakdown bilaterally: Yes Sensation  Testing Intact to touch and monofilament testing bilaterally: Yes Pulse Check Posterior Tibialis and Dorsalis pulse intact bilaterally: Yes Comments     Lymphadenopathy:     Cervical: No cervical adenopathy.  Skin:    General: Skin is warm and dry.  Neurological:     Mental Status: She is alert and oriented to person, place, and time.     Deep Tendon Reflexes:     Reflex Scores:      Patellar reflexes are 2+ on the right side and 2+ on the left side. Psychiatric:        Behavior: Behavior normal.        Judgment: Judgment normal.    BP 104/60 (BP Location: Right Arm, Patient Position: Sitting, Cuff Size: Small)   Pulse 66   Temp 98.3 F (36.8 C) (Oral)   Resp 16   Ht 5\' 9"  (1.753 m)   Wt 198 lb (89.8 kg)   LMP 04/06/2012   SpO2 100%   BMI 29.24 kg/m  Wt Readings from Last 3 Encounters:  11/20/20 198 lb (89.8 kg)  05/29/20 197 lb 6.4 oz (89.5 kg)  05/26/20 195 lb 8 oz (88.7 kg)       Assessment & Plan:   Problem List Items Addressed This Visit       Unprioritized   Type 2 diabetes mellitus without complication, without long-term current use of insulin (HCC)    A1C at goal. Management per endo.       Relevant Orders   Basic metabolic panel   Thyromegaly    New. Will check thyroid US and TFT's. She does have a hx of Grave's disease with most recent TFT's WNL.  Relevant Orders   US THYROID   TSH   T3, free   T4, free   Preventative health care - Primary    Discussed healthy diet and regular exercise. Pap/colo up to date. Mammo will be due in the end of January-order placed. Flu shot today as well as prevnar 20. Recommended covid bivalent booster at the pharmacy.       Relevant Orders   MM 3D SCREEN BREAST BILATERAL   Hyperlipidemia    Lab Results  Component Value Date   CHOL 157 05/26/2020   HDL 47.20 05/26/2020   LDLCALC 95 05/26/2020   TRIG 70.0 05/26/2020   CHOLHDL 3 05/26/2020  LDL at goal. Continue atorvastatin 10mg  once daily.        Other Visit Diagnoses     Needs flu shot       Relevant Orders   Flu Vaccine QUAD 6+ mos PF IM (Fluarix Quad PF)      No orders of the defined types were placed in this encounter.   I, Debbrah Alar, NP, personally preformed the services described in this documentation.  All medical record entries made by the scribe were at my direction and in my presence.  I have reviewed the chart and discharge instructions (if applicable) and agree that the record reflects my personal performance and is accurate and complete. 11/20/2020  I,Lyric Barr-McArthur,acting as a Education administrator for Nance Pear, NP.,have documented all relevant documentation on the behalf of Nance Pear, NP,as directed by  Nance Pear, NP while in the presence of Nance Pear, NP.  Nance Pear, NP

## 2020-11-20 NOTE — Patient Instructions (Signed)
Please complete lab work prior to leaving.   

## 2020-11-20 NOTE — Assessment & Plan Note (Signed)
A1C at goal. Management per endo.

## 2020-11-20 NOTE — Assessment & Plan Note (Signed)
Lab Results  Component Value Date   CHOL 157 05/26/2020   HDL 47.20 05/26/2020   LDLCALC 95 05/26/2020   TRIG 70.0 05/26/2020   CHOLHDL 3 05/26/2020   LDL at goal. Continue atorvastatin 10mg  once daily.

## 2020-11-20 NOTE — Assessment & Plan Note (Signed)
New. Will check thyroid US and TFT's. She does have a hx of Grave's disease with most recent TFT's WNL.

## 2020-11-20 NOTE — Assessment & Plan Note (Signed)
Discussed healthy diet and regular exercise. Pap/colo up to date. Mammo will be due in the end of January-order placed. Flu shot today as well as prevnar 20. Recommended covid bivalent booster at the pharmacy.

## 2020-11-26 ENCOUNTER — Ambulatory Visit (HOSPITAL_BASED_OUTPATIENT_CLINIC_OR_DEPARTMENT_OTHER): Admission: RE | Admit: 2020-11-26 | Payer: 59 | Source: Ambulatory Visit

## 2020-11-26 ENCOUNTER — Encounter (HOSPITAL_BASED_OUTPATIENT_CLINIC_OR_DEPARTMENT_OTHER): Payer: Self-pay

## 2020-12-01 ENCOUNTER — Encounter: Payer: Self-pay | Admitting: Internal Medicine

## 2020-12-01 ENCOUNTER — Other Ambulatory Visit: Payer: Self-pay

## 2020-12-01 ENCOUNTER — Ambulatory Visit (INDEPENDENT_AMBULATORY_CARE_PROVIDER_SITE_OTHER): Payer: 59 | Admitting: Internal Medicine

## 2020-12-01 ENCOUNTER — Other Ambulatory Visit: Payer: Self-pay | Admitting: Cardiology

## 2020-12-01 VITALS — BP 132/80 | HR 90 | Ht 69.0 in | Wt 197.0 lb

## 2020-12-01 DIAGNOSIS — E0500 Thyrotoxicosis with diffuse goiter without thyrotoxic crisis or storm: Secondary | ICD-10-CM | POA: Diagnosis not present

## 2020-12-01 DIAGNOSIS — E785 Hyperlipidemia, unspecified: Secondary | ICD-10-CM

## 2020-12-01 DIAGNOSIS — E119 Type 2 diabetes mellitus without complications: Secondary | ICD-10-CM | POA: Diagnosis not present

## 2020-12-01 DIAGNOSIS — E1165 Type 2 diabetes mellitus with hyperglycemia: Secondary | ICD-10-CM | POA: Diagnosis not present

## 2020-12-01 LAB — POCT GLYCOSYLATED HEMOGLOBIN (HGB A1C): Hemoglobin A1C: 7.7 % — AB (ref 4.0–5.6)

## 2020-12-01 MED ORDER — GLIPIZIDE 10 MG PO TABS
ORAL_TABLET | ORAL | 3 refills | Status: DC
Start: 2020-12-01 — End: 2021-10-22

## 2020-12-01 MED ORDER — DAPAGLIFLOZIN PROPANEDIOL 10 MG PO TABS: 10.0000 mg | ORAL_TABLET | Freq: Every day | ORAL | 3 refills | Status: DC

## 2020-12-01 NOTE — Progress Notes (Signed)
Name: Sarah Ibarra  Age/ Sex: 55 y.o., female   MRN/ DOB: [id number], 01/09/1966     PCP: Debbrah Alar, NP   Reason for Endocrinology Evaluation: Type 2 Diabetes Mellitus  Initial Endocrine Consultative Visit: 12/12/2018    PATIENT IDENTIFIER: Sarah Ibarra is a 55 y.o. female with a past medical history of T2DM and Dyslipidemia. The patient has followed with Endocrinology clinic since 12/12/2018 for consultative assistance with management of her diabetes.  DIABETIC HISTORY:  Sarah Ibarra was diagnosed with DM in 2009, she has been on Metformin since diagnosis.Her hemoglobin A1c has ranged from 8.3% in 2019, peaking at 12.5% in 2020 On her initial visit to our clinic she had an A1c of 12.5%, she declined insulin at the time and opted for Glipizide. We stopped Metformin due to nausea   Farxiga started 01/2020   THYROID HISTORY:  Pt has been noted to have suppressed TSH < 0.01 uIU/mL with elevated FT4 3.11 ng/dL during routine workup. She was noted to have weight loss at the time of her presentation but no other symptoms. She was started on Methimazole 11/2018 TRAB level slightly elevated at 4.91 IU/L    Mother with thyroid disease  SUBJECTIVE:   During the last visit (05/26/2020): A1c 6.9 % . We continued Glipizide and increased  [place]      Today (12/01/2020): Sarah Ibarra is here for a follow up on diabetes.  She checks her blood sugars 1 times daily. The patient has not had hypoglycemic episodes since the last clinic visit.    Weight stable  Denies nausea or  Diarrhea Denies palpitations   Denies local neck symptoms - scheduled for thyroid ultrasound through PCP      HOME ENDOCRINE REGIMEN:  Glipizide 10 mg BID  Farxiga 10 mg daily      Statin: Yes ACE-I/ARB: No    METER DOWNLOAD SUMMARY: [place]  90 day average 117 mg/dL  Low 86 High 0000000   DIABETIC COMPLICATIONS: Microvascular complications:   Denies: CKD , neuropathy, retinopathy  Last  Eye Exam: Completed  10/2020  Macrovascular complications:   Denies: CAD, CVA, PVD   HISTORY:  Past Medical History:  Past Medical History:  Diagnosis Date   Arthritis    low back pain   Chicken pox as a child   Depression with anxiety    pt denies   Diabetes mellitus without complication ([place])    Frequent headaches    Hx: UTI (urinary tract infection)    Hyperlipidemia    Hyperthyroidism 10/08/2018   Migraines    Mumps as a child   Overweight    UTI (urinary tract infection)    Past Surgical History:  Past Surgical History:  Procedure Laterality Date   CARDIOVERSION  12/13/2018   [place]   Social History:  reports that she has never smoked. She has never used smokeless tobacco. She reports that she does not drink alcohol and does not use drugs. Family History:  Family History  Problem Relation Age of Onset   Other Mother        low blood pressure   Hyperlipidemia Mother    Multiple sclerosis Son    Stroke Maternal Grandmother    Diabetes Maternal Grandmother        type 2   Alzheimer's disease Paternal Grandfather    Colon cancer Neg Hx    Colon polyps Neg Hx    Esophageal cancer Neg Hx  Stomach cancer Neg Hx    Rectal cancer Neg Hx      HOME MEDICATIONS: Allergies as of 12/01/2020       Reactions   Morphine And Related Hives        Medication List        Accurate as of December 01, 2020  9:39 AM. If you have any questions, ask your nurse or doctor.          atorvastatin 10 MG tablet Commonly known as: LIPITOR TAKE 1 TABLET(10 MG) BY MOUTH DAILY   dapagliflozin propanediol 10 MG Tabs tablet Commonly known as: Farxiga Take 1 tablet (10 mg total) by mouth daily.   glipiZIDE 10 MG tablet Commonly known as: GLUCOTROL TAKE 1 TABLET(10 MG) BY MOUTH TWICE DAILY BEFORE A MEAL   metoprolol tartrate 25 MG tablet Commonly known as: LOPRESSOR Take 1 tablet (25 mg total) by mouth 2 (two) times daily.          OBJECTIVE:   Vital Signs: BP 132/80 (BP Location: Left Arm, Patient Position: Sitting, Cuff Size: Small)   Pulse 90   Ht 5\' 9"  (1.753 m)   Wt 197 lb (89.4 kg)   LMP 04/06/2012   SpO2 96%   BMI 29.09 kg/m   Wt Readings from Last 3 Encounters:  12/01/20 197 lb (89.4 kg)  11/20/20 198 lb (89.8 kg)  05/29/20 197 lb 6.4 oz (89.5 kg)     Exam: General: Pt appears well and is in NAD  Neck: General: Supple without adenopathy. Thyroid: Prominent thyroid   Lungs: Clear with good BS   Heart: RRR   Abdomen: Normoactive bowel sounds, soft, nontender, without masses or organomegaly palpable  Extremities: No pretibial edema.   Neuro: MS is good with appropriate affect, pt is alert and Ox3    DM foot exam: 12/01/2020  The skin of the feet is without sores or ulcerations. The pedal pulses are 2+ on right and 2+ on left. The sensation is intact to a screening 5.07, 10 gram monofilament bilaterally    DATA REVIEWED:  Lab Results  Component Value Date   HGBA1C 7.7 (A) 12/01/2020   HGBA1C 6.9 (A) 05/26/2020   HGBA1C 7.9 (A) 01/21/2020   Results for CAZANDRA, TURO (MRN [id number]) as of 12/01/2020 09:33  Ref. Range 11/20/2020 07:32  Sodium Latest Ref Range: 135 - 145 mEq/L 141  Potassium Latest Ref Range: 3.5 - 5.1 mEq/L 3.8  Chloride Latest Ref Range: 96 - 112 mEq/L 104  CO2 Latest Ref Range: 19 - 32 mEq/L 29  Glucose Latest Ref Range: 70 - 99 mg/dL 105 (H)  BUN Latest Ref Range: 6 - 23 mg/dL 14  Creatinine Latest Ref Range: 0.40 - 1.20 mg/dL 0.97  Calcium Latest Ref Range: 8.4 - 10.5 mg/dL 9.2  GFR Latest Ref Range: >60.00 mL/min 64.81   Results for MYLEKA, MCBEE (MRN [id number]) as of 12/01/2020 09:33  Ref. Range 11/20/2020 07:32  TSH Latest Ref Range: 0.35 - 5.50 uIU/mL 3.13  Triiodothyronine,Free,Serum Latest Ref Range: 2.3 - 4.2 pg/mL 3.0  T4,Free(Direct) Latest Ref Range: 0.60 - 1.60 ng/dL 0.98     ASSESSMENT / PLAN / RECOMMENDATIONS:   1) Type 2  Diabetes Mellitus, Sub- Optimally controlled, With out complications - Most recent A1c of 7.7  %. Goal A1c <7.0%.  - A1c has trended up despite increasing Farxiga, will increase Glipizide as below  - Emphasized the importance of low carb diet     MEDICATIONS: Increase  glipizide 10 mg  to 1.5 tablets before Breakfast and 1 tablet before supper   Continue  Farxiga  10 mg , 1 tablet daily     EDUCATION / INSTRUCTIONS: BG monitoring instructions: Patient is instructed to check her blood sugars 1 times a day, alternating between fasting and supper. Call [facility] if: BG persistently < 70  I reviewed the Rule of 15 for the treatment of hypoglycemia in detail with the patient. Literature supplied.     2) Diabetic complications:  Eye: Does night have known diabetic retinopathy.  Neuro/ Feet: Does not have known diabetic peripheral neuropathy .  Renal: Patient does not have known baseline CKD. She   is not on an ACEI/ARB at present.  Normal MA/CR ratio.    3) Graves' Disease:    - She was on Methimazole for approximately a year 11/2018 to 10/2019 - No extrathyroidal manifestations of Graves' disease - In remission  - Clinically and biochemically euthyroid  - Pending thyroid ultrasound through PCP     4) Dyslipidemia :   - LDL and Tg is at goal   Medication Continue atorvastatin 10 mg daily   F/U in 6 months   Signed electronically by: Lyndle Herrlich, MD  [facility]  [facility] [address] [place], [place] [address] Phone: [phone] FAX: [phone]   CC: Sandford Craze, NP [address] [place] [address] Phone: [phone]  Fax: [phone]  Return to Endocrinology clinic as below: Future Appointments  Date Time Provider Department Center  12/01/2020  9:50 AM Alexsandro Salek, Konrad Dolores, MD [facility]  12/08/2020  9:00 AM [facility]   02/15/2021  8:00 AM [facility]  05/21/2021  7:00 AM Sandford Craze, NP [facility]

## 2020-12-01 NOTE — Patient Instructions (Addendum)
-   Increase Glipizide 10 mg, one and a half tablets  before Breakfast and continue 1 tablet before Supper  - Continue farxiga 10 mg daily     HOW TO TREAT LOW BLOOD SUGARS (Blood sugar LESS THAN 70 MG/DL) Please follow the RULE OF 15 for the treatment of hypoglycemia treatment (when your (blood sugars are less than 70 mg/dL)   STEP 1: Take 15 grams of carbohydrates when your blood sugar is low, which includes:  3-4 GLUCOSE TABS  OR 3-4 OZ OF JUICE OR REGULAR SODA OR ONE TUBE OF GLUCOSE GEL    STEP 2: RECHECK blood sugar in 15 MINUTES STEP 3: If your blood sugar is still low at the 15 minute recheck --> then, go back to STEP 1 and treat AGAIN with another 15 grams of carbohydrates.

## 2020-12-08 ENCOUNTER — Telehealth: Payer: Self-pay | Admitting: Family

## 2020-12-08 ENCOUNTER — Other Ambulatory Visit: Payer: Self-pay

## 2020-12-08 ENCOUNTER — Ambulatory Visit (HOSPITAL_BASED_OUTPATIENT_CLINIC_OR_DEPARTMENT_OTHER)
Admission: RE | Admit: 2020-12-08 | Discharge: 2020-12-08 | Disposition: A | Discharge status: Home / Self Care | Payer: 59 | Source: Ambulatory Visit | Attending: Family | Admitting: Family

## 2020-12-08 DIAGNOSIS — E010 Iodine-deficiency related diffuse (endemic) goiter: Secondary | ICD-10-CM | POA: Diagnosis not present

## 2020-12-08 DIAGNOSIS — R591 Generalized enlarged lymph nodes: Secondary | ICD-10-CM

## 2020-12-08 NOTE — Telephone Encounter (Signed)
Windell Moulding, please advise pt that her thyroid ultrasound does show some thyroid nodules.  We should repeat an [place] in 1 year to make sure it remains stable. The radiologist noted some enlarged lymph nodes in her neck and recommended that we take a closer look with a CT scan.  Order has been placed.    Dr. Lonzo Cloud- FYI.  I have sent myself a reminder to repeat the [place] in 1 year.

## 2020-12-08 NOTE — Telephone Encounter (Signed)
Lvm for patient to call back for results 

## 2020-12-15 ENCOUNTER — Telehealth: Payer: Self-pay | Admitting: Family

## 2020-12-15 NOTE — Telephone Encounter (Signed)
Patient advised of results, provider's comments and recommended CT. She verbalized understanding

## 2020-12-15 NOTE — Telephone Encounter (Signed)
[phone]  Reviewed with insurance and above approval number was obtained.

## 2020-12-15 NOTE — Telephone Encounter (Signed)
Do you mind trying her again please?

## 2020-12-15 NOTE — Telephone Encounter (Signed)
-----   Message from [facility] sent at 12/08/2020  3:41 PM EST ----- CT was denied, states a peer to peer required from the doctor. I faxed over clinicals anyway, however representative stated it would still required peer to peer. Case # [id number] tel  #[phone] option 3'

## 2020-12-17 ENCOUNTER — Ambulatory Visit (HOSPITAL_BASED_OUTPATIENT_CLINIC_OR_DEPARTMENT_OTHER)
Admission: RE | Admit: 2020-12-17 | Discharge: 2020-12-17 | Disposition: A | Discharge status: Home / Self Care | Payer: 59 | Source: Ambulatory Visit | Attending: Family | Admitting: Family

## 2020-12-17 ENCOUNTER — Other Ambulatory Visit: Payer: Self-pay

## 2020-12-17 ENCOUNTER — Encounter (HOSPITAL_BASED_OUTPATIENT_CLINIC_OR_DEPARTMENT_OTHER): Payer: Self-pay

## 2020-12-17 DIAGNOSIS — R591 Generalized enlarged lymph nodes: Secondary | ICD-10-CM | POA: Insufficient documentation

## 2020-12-17 MED ORDER — IOHEXOL 300 MG/ML  SOLN
100.0000 mL | Freq: Once | INTRAMUSCULAR | Status: AC | PRN
  Administered 2020-12-17: 75 mL via INTRAVENOUS

## 2021-02-15 ENCOUNTER — Other Ambulatory Visit: Payer: Self-pay

## 2021-02-15 ENCOUNTER — Ambulatory Visit (HOSPITAL_BASED_OUTPATIENT_CLINIC_OR_DEPARTMENT_OTHER)
Admission: RE | Admit: 2021-02-15 | Discharge: 2021-02-15 | Disposition: A | Discharge status: Home / Self Care | Payer: 59 | Source: Ambulatory Visit | Attending: Family | Admitting: Family

## 2021-02-15 ENCOUNTER — Ambulatory Visit (HOSPITAL_BASED_OUTPATIENT_CLINIC_OR_DEPARTMENT_OTHER): Payer: 59

## 2021-02-15 ENCOUNTER — Encounter (HOSPITAL_BASED_OUTPATIENT_CLINIC_OR_DEPARTMENT_OTHER): Payer: Self-pay

## 2021-02-15 DIAGNOSIS — Z1231 Encounter for screening mammogram for malignant neoplasm of breast: Secondary | ICD-10-CM | POA: Diagnosis not present

## 2021-02-15 DIAGNOSIS — Z0000 Encounter for general adult medical examination without abnormal findings: Secondary | ICD-10-CM | POA: Insufficient documentation

## 2021-04-06 ENCOUNTER — Ambulatory Visit: Payer: 59 | Admitting: Internal Medicine

## 2021-04-06 NOTE — Progress Notes (Deleted)
?Name: Sarah Ibarra  ?Age/ Sex: 56 y.o., female   ?MRN/ DOB: [id number], [birth year]    ? ?PCP: Sarah Craze, NP   ?Reason for Endocrinology Evaluation: Type 2 Diabetes Mellitus  ?Initial Endocrine Consultative Visit: 12/12/2018  ? ? ?PATIENT IDENTIFIER: Sarah Ibarra is a 56 y.o. female with a past medical history of T2DM and Dyslipidemia. The patient has followed with Endocrinology clinic since 12/12/2018 for consultative assistance with management of her diabetes. ? ?DIABETIC HISTORY:  ?Sarah Ibarra was diagnosed with DM in 2009, she has been on Metformin since diagnosis.Her hemoglobin A1c has ranged from 8.3% in 2019, peaking at 12.5% in 2020 ?On her initial visit to our clinic she had an A1c of 12.5%, she declined insulin at the time and opted for Glipizide. We stopped Metformin due to nausea  ? ?Sarah Ibarra started 01/2020 ? ? ?THYROID HISTORY:  ?Pt has been noted to have suppressed TSH < 0.01 uIU/mL with elevated FT4 3.11 ng/dL during routine workup. She was noted to have weight loss at the time of her presentation but no other symptoms. She was started on Methimazole 11/2018 ?TRAB level slightly elevated at 4.91 IU/L  ?  ?Mother with thyroid disease  ?SUBJECTIVE:  ? ?During the last visit (12/01/2020): A1c 7.7 % . We increased glipizide and continued [place]  ? ? ? ? ?Today (04/06/2021): Sarah Ibarra is here for a follow up on diabetes.  She checks her blood sugars 1 times daily. The patient has not had hypoglycemic episodes since the last clinic visit. ? ? ? ?Weight stable  ?Denies nausea or  Diarrhea ?Denies palpitations  ? ?Denies local neck symptoms - scheduled for thyroid ultrasound through PCP  ? ? ? ? ?HOME ENDOCRINE REGIMEN:  ?Glipizide 10 mg, 1.5 tabs before breakfast and 1 tab before supper ?Farxiga 10 mg daily  ? ? ? ? ?Statin: Yes ?ACE-I/ARB: No ? ? ? ?METER DOWNLOAD SUMMARY: [place] ? ?90 day average 117 mg/dL  ?Low 86 ?High 158 ? ? ?DIABETIC COMPLICATIONS: ?Microvascular complications:   ? ?Denies: CKD , neuropathy, retinopathy  ?Last Eye Exam: Completed  10/2020 ? ?Macrovascular complications:  ? ?Denies: CAD, CVA, PVD ? ? ?HISTORY:  ?Past Medical History:  ?Past Medical History:  ?Diagnosis Date  ? Arthritis   ? low back pain  ? Chicken pox as a child  ? Depression with anxiety   ? pt denies  ? Diabetes mellitus without complication (HCC)   ? Frequent headaches   ? Hx: UTI (urinary tract infection)   ? Hyperlipidemia   ? Hyperthyroidism 10/08/2018  ? Migraines   ? Mumps as a child  ? Overweight   ? UTI (urinary tract infection)   ? ?Past Surgical History:  ?Past Surgical History:  ?Procedure Laterality Date  ? CARDIOVERSION  12/13/2018  ? CESAREAN SECTION  1993  ? TUBAL LIGATION  1993  ? ?Social History:  reports that she has never smoked. She has never used smokeless tobacco. She reports that she does not drink alcohol and does not use drugs. ?Family History:  ?Family History  ?Problem Relation Age of Onset  ? Other Mother   ?     low blood pressure  ? Hyperlipidemia Mother   ? Multiple sclerosis Son   ? Stroke Maternal Grandmother   ? Diabetes Maternal Grandmother   ?     type 2  ? Alzheimer's disease Paternal Grandfather   ? Colon cancer Neg Hx   ? Colon polyps Neg Hx   ? Esophageal  cancer Neg Hx   ? Stomach cancer Neg Hx   ? Rectal cancer Neg Hx   ? ? ? ?HOME MEDICATIONS: ?Allergies as of 04/06/2021   ? ?   Reactions  ? Morphine And Related Hives  ? ?  ? ?  ?Medication List  ?  ? ?  ? Accurate as of April 06, 2021  7:27 AM. If you have any questions, ask your nurse or doctor.  ?  ?  ? ?  ? ?atorvastatin 10 MG tablet ?Commonly known as: LIPITOR ?TAKE 1 TABLET(10 MG) BY MOUTH DAILY ?  ?dapagliflozin propanediol 10 MG Tabs tablet ?Commonly known as: [place] ?Take 1 tablet (10 mg total) by mouth daily. ?  ?glipiZIDE 10 MG tablet ?Commonly known as: GLUCOTROL ?Take 1.5 tablets (15 mg total) by mouth daily before breakfast AND 1 tablet (10 mg total) daily before supper. ?  ?metoprolol tartrate 25 MG  tablet ?Commonly known as: LOPRESSOR ?TAKE 1 TABLET BY MOUTH  TWICE DAILY ?  ? ?  ? ? ? ?OBJECTIVE:  ? ?Vital Signs: LMP 04/06/2012   ?Wt Readings from Last 3 Encounters:  ?12/01/20 197 lb (89.4 kg)  ?11/20/20 198 lb (89.8 kg)  ?05/29/20 197 lb 6.4 oz (89.5 kg)  ? ? ? ?Exam: ?General: Pt appears well and is in NAD  ?Neck: General: Supple without adenopathy. ?Thyroid: Prominent thyroid   ?Lungs: Clear with good BS   ?Heart: RRR   ?Abdomen: Normoactive bowel sounds, soft, nontender, without masses or organomegaly palpable  ?Extremities: No pretibial edema.   ?Neuro: MS is good with appropriate affect, pt is alert and Ox3  ? ? ?DM foot exam: 12/01/2020 ? ?The skin of the feet is without sores or ulcerations. ?The pedal pulses are 2+ on right and 2+ on left. ?The sensation is intact to a screening 5.07, 10 gram monofilament bilaterally ? ? ? ?DATA REVIEWED: ? ?Lab Results  ?Component Value Date  ? HGBA1C 7.7 (A) 12/01/2020  ? HGBA1C 6.9 (A) 05/26/2020  ? HGBA1C 7.9 (A) 01/21/2020  ? ?Results for Sarah Ibarra (MRN [id number]) as of 12/01/2020 09:33 ? Ref. Range 11/20/2020 07:32  ?Sodium Latest Ref Range: 135 - 145 mEq/L 141  ?Potassium Latest Ref Range: 3.5 - 5.1 mEq/L 3.8  ?Chloride Latest Ref Range: 96 - 112 mEq/L 104  ?CO2 Latest Ref Range: 19 - 32 mEq/L 29  ?Glucose Latest Ref Range: 70 - 99 mg/dL 105 (H)  ?BUN Latest Ref Range: 6 - 23 mg/dL 14  ?Creatinine Latest Ref Range: 0.40 - 1.20 mg/dL 0.97  ?Calcium Latest Ref Range: 8.4 - 10.5 mg/dL 9.2  ?GFR Latest Ref Range: >60.00 mL/min 64.81  ? ?Results for Sarah Ibarra (MRN [id number]) as of 12/01/2020 09:33 ? Ref. Range 11/20/2020 07:32  ?TSH Latest Ref Range: 0.35 - 5.50 uIU/mL 3.13  ?Triiodothyronine,Free,Serum Latest Ref Range: 2.3 - 4.2 pg/mL 3.0  ?T4,Free(Direct) Latest Ref Range: 0.60 - 1.60 ng/dL 0.98  ? ?Thyroid ultrasound 12/08/2020 ? ?FINDINGS: ?Parenchymal Echotexture: Mildly heterogeneous ?  ?Isthmus: 0.8 cm ?  ?Right lobe: 6.3 x 1.9 x 2.6 cm ?  ?Left  lobe: 7.0 x 2.7 x 3.1 cm ?  ?_________________________________________________________ ?  ?Estimated total number of nodules >/= 1 cm: 4 ?  ?Number of spongiform nodules >/=  2 cm not described below (TR1): 0 ?  ?Number of mixed cystic and solid nodules >/= 1.5 cm not described ?below ([place]): 0 ?  ?_________________________________________________________ ?  ?Nodule # 1: ?  ?Location: Isthmus; superior ?  ?  Maximum size: 1.4 cm; Other 2 dimensions: 1.0 x 0.6 cm ?  ?Composition: solid/almost completely solid (2) ?  ?Echogenicity: hypoechoic (2) ?  ?Shape: not taller-than-wide (0) ?  ?Margins: smooth (0) ?  ?Echogenic foci: none (0) ?  ?ACR TI-RADS total points: 4. ?  ?ACR TI-RADS risk category: TR4 (4-6 points). ?  ?ACR TI-RADS recommendations: ?  ?*Given size (>/= 1 - 1.4 cm) and appearance, a follow-up ultrasound ?in 1 year should be considered based on TI-RADS criteria. ?  ?_________________________________________________________ ?  ?Nodule 2: 1.0 x 0.9 x 0.3 cm predominantly cystic nodule in the mid ?isthmus does not meet criteria for FNA or imaging surveillance. ?  ?_________________________________________________________ ?  ?Nodule # 3: ?  ?Location: Isthmus; inferior ?  ?Maximum size: 0.8 cm; Other 2 dimensions: 0.5 x 0.6 cm ?  ?Composition: cystic/almost completely cystic (0) ?  ?Echogenicity: anechoic (0) ?  ?Shape: not taller-than-wide (0) ?  ?Margins: smooth (0) ?  ?Echogenic foci: peripheral calcifications (2) ?  ?ACR TI-RADS total points: 2. ?  ?ACR TI-RADS risk category: TR2 (2 points). ?  ?ACR TI-RADS recommendations: ?  ?This nodule does NOT meet TI-RADS criteria for biopsy or dedicated ?follow-up. ?  ?_________________________________________________________ ?  ?Nodule 4: 0.9 x 0.6 x 0.8 cm isoechoic nodule in the mid right ?thyroid lobe does not meet criteria for imaging surveillance or FNA. ?  ?_________________________________________________________ ?  ?Nodule 5: 2.6 x 0.8 x 2.1 cm isoechoic  region in the superior left ?thyroid lobe is favored to be a pseudo nodule as it lacks ?well-defined margins. ?  ?_________________________________________________________ ?  ?Nodule 6: 1.1 x 1.0 x 0.4 cm spongif

## 2021-04-22 ENCOUNTER — Encounter: Payer: Self-pay | Admitting: Internal Medicine

## 2021-04-22 ENCOUNTER — Ambulatory Visit (INDEPENDENT_AMBULATORY_CARE_PROVIDER_SITE_OTHER): Payer: 59 | Admitting: Internal Medicine

## 2021-04-22 VITALS — BP 144/96 | HR 67 | Ht 69.0 in | Wt 199.0 lb

## 2021-04-22 DIAGNOSIS — Z8639 Personal history of other endocrine, nutritional and metabolic disease: Secondary | ICD-10-CM | POA: Diagnosis not present

## 2021-04-22 DIAGNOSIS — R739 Hyperglycemia, unspecified: Secondary | ICD-10-CM

## 2021-04-22 DIAGNOSIS — E119 Type 2 diabetes mellitus without complications: Secondary | ICD-10-CM | POA: Diagnosis not present

## 2021-04-22 DIAGNOSIS — E042 Nontoxic multinodular goiter: Secondary | ICD-10-CM

## 2021-04-22 LAB — POCT GLYCOSYLATED HEMOGLOBIN (HGB A1C): Hemoglobin A1C: 7.3 % — AB (ref 4.0–5.6)

## 2021-04-22 MED ORDER — EMPAGLIFLOZIN 25 MG PO TABS: 25.0000 mg | ORAL_TABLET | Freq: Every day | ORAL | 3 refills | Status: DC

## 2021-04-22 NOTE — Progress Notes (Signed)
?Name: Sarah Ibarra  ?Age/ Sex: 56 y.o., female   ?MRN/ DOB: [id number], 09/28/1965    ? ?PCP: Debbrah Alar, NP   ?Reason for Endocrinology Evaluation: Type 2 Diabetes Mellitus  ?Initial Endocrine Consultative Visit: 12/12/2018  ? ? ?PATIENT IDENTIFIER: Sarah Ibarra is a 56 y.o. female with a past medical history of T2DM and Dyslipidemia. The patient has followed with Endocrinology clinic since 12/12/2018 for consultative assistance with management of her diabetes. ? ?DIABETIC HISTORY:  ?Sarah Ibarra was diagnosed with DM in 2009, she has been on Metformin since diagnosis.Her hemoglobin A1c has ranged from 8.3% in 2019, peaking at 12.5% in 2020 ?On her initial visit to our clinic she had an A1c of 12.5%, she declined insulin at the time and opted for Glipizide. We stopped Metformin due to nausea  ? ?[place] started 01/2020 but stopped Farxiga 2023 due to cost of $1400 ? ? ?THYROID HISTORY:  ?Pt has been noted to have suppressed TSH < 0.01 uIU/mL with elevated FT4 3.11 ng/dL during routine workup. She was noted to have weight loss at the time of her presentation but no other symptoms. She was started on Methimazole 11/2018 ?TRAB level slightly elevated at 4.91 IU/L  ?  ?Mother with thyroid disease  ?SUBJECTIVE:  ? ?During the last visit (12/01/2020): A1c 7.7 % . We increased glipizide and continued [place]  ? ? ? ? ?Today (04/22/2021): Sarah Ibarra is here for a follow up on diabetes.  She checks her blood sugars 1 times daily. The patient has not had hypoglycemic episodes since the last clinic visit. ? ? ?She has been without [place] for ~ 2 weeks   , due to cost  ?Denies nausea or  Diarrhea ?She has one episode of  palpitations , last month  ? ?She has occasional local neck symptoms  ? ? ? ? ?HOME ENDOCRINE REGIMEN:  ?Glipizide 10 mg, 1.5 tabs before breakfast and 1 tab before supper ?Farxiga 10 mg daily  ? ? ? ? ?Statin: Yes ?ACE-I/ARB: No ? ? ? ?METER DOWNLOAD SUMMARY: [place] ? ?90 day average 126  mg/dL   ?Low 85  ?High 166 ?Average 127 mg/dL  ? ? ?DIABETIC COMPLICATIONS: ?Microvascular complications:  ? ?Denies: CKD , neuropathy, retinopathy  ?Last Eye Exam: Completed  10/2020 ? ?Macrovascular complications:  ? ?Denies: CAD, CVA, PVD ? ? ?HISTORY:  ?Past Medical History:  ?Past Medical History:  ?Diagnosis Date  ? Arthritis   ? low back pain  ? Chicken pox as a child  ? Depression with anxiety   ? pt denies  ? Diabetes mellitus without complication ([facility])   ? Frequent headaches   ? Hx: UTI (urinary tract infection)   ? Hyperlipidemia   ? Hyperthyroidism 10/08/2018  ? Migraines   ? Mumps as a child  ? Overweight   ? UTI (urinary tract infection)   ? ?Past Surgical History:  ?Past Surgical History:  ?Procedure Laterality Date  ? CARDIOVERSION  12/13/2018  ? [place]  ? TUBAL LIGATION  1993  ? ?Social History:  reports that she has never smoked. She has never used smokeless tobacco. She reports that she does not drink alcohol and does not use drugs. ?Family History:  ?Family History  ?Problem Relation Age of Onset  ? Other Mother   ?     low blood pressure  ? Hyperlipidemia Mother   ? Multiple sclerosis Son   ? Stroke Maternal Grandmother   ? Diabetes Maternal Grandmother   ?  type 2  ? Alzheimer's disease Paternal Grandfather   ? Colon cancer Neg Hx   ? Colon polyps Neg Hx   ? Esophageal cancer Neg Hx   ? Stomach cancer Neg Hx   ? Rectal cancer Neg Hx   ? ? ? ?HOME MEDICATIONS: ?Allergies as of 04/22/2021   ? ?   Reactions  ? Morphine And Related Hives  ? ?  ? ?  ?Medication List  ?  ? ?  ? Accurate as of April 22, 2021 10:23 AM. If you have any questions, ask your nurse or doctor.  ?  ?  ? ?  ? ?STOP taking these medications   ? ?dapagliflozin propanediol 10 MG Tabs tablet ?Commonly known as: [place] ?Stopped by: Sarah Sciara, MD ?  ? ?  ? ?TAKE these medications   ? ?atorvastatin 10 MG tablet ?Commonly known as: LIPITOR ?TAKE 1 TABLET(10 MG) BY MOUTH DAILY ?  ?glipiZIDE 10 MG tablet ?Commonly  known as: GLUCOTROL ?Take 1.5 tablets (15 mg total) by mouth daily before breakfast AND 1 tablet (10 mg total) daily before supper. ?  ?metoprolol tartrate 25 MG tablet ?Commonly known as: LOPRESSOR ?TAKE 1 TABLET BY MOUTH  TWICE DAILY ?  ? ?  ? ? ? ?OBJECTIVE:  ? ?Vital Signs: BP (!) 144/96 (BP Location: Left Arm, Patient Position: Sitting, Cuff Size: Large)   Pulse 67   Ht 5\' 9"  (1.753 m)   Wt 199 lb (90.3 kg)   LMP 04/06/2012   SpO2 98%   BMI 29.39 kg/m?   ?Wt Readings from Last 3 Encounters:  ?04/22/21 199 lb (90.3 kg)  ?12/01/20 197 lb (89.4 kg)  ?11/20/20 198 lb (89.8 kg)  ? ? ? ?Exam: ?General: Pt appears well and is in NAD  ?Neck: General: Supple without adenopathy. ?Thyroid: Prominent thyroid   ?Lungs: Clear with good BS   ?Heart: RRR   ?Abdomen: Normoactive bowel sounds, soft, nontender, without masses or organomegaly palpable  ?Extremities: No pretibial edema.   ?Neuro: MS is good with appropriate affect, pt is alert and Ox3  ? ? ?DM foot exam: 12/01/2020 ? ?The skin of the feet is without sores or ulcerations. ?The pedal pulses are 2+ on right and 2+ on left. ?The sensation is intact to a screening 5.07, 10 gram monofilament bilaterally ? ? ? ?DATA REVIEWED: ? ?Lab Results  ?Component Value Date  ? HGBA1C 7.3 (A) 04/22/2021  ? HGBA1C 7.7 (A) 12/01/2020  ? HGBA1C 6.9 (A) 05/26/2020  ? ?Results for [name], [name] (MRN [id number]) as of 12/01/2020 09:33 ? Ref. Range 11/20/2020 07:32  ?Sodium Latest Ref Range: 135 - 145 mEq/L 141  ?Potassium Latest Ref Range: 3.5 - 5.1 mEq/L 3.8  ?Chloride Latest Ref Range: 96 - 112 mEq/L 104  ?CO2 Latest Ref Range: 19 - 32 mEq/L 29  ?Glucose Latest Ref Range: 70 - 99 mg/dL 105 (H)  ?BUN Latest Ref Range: 6 - 23 mg/dL 14  ?Creatinine Latest Ref Range: 0.40 - 1.20 mg/dL 0.97  ?Calcium Latest Ref Range: 8.4 - 10.5 mg/dL 9.2  ?GFR Latest Ref Range: >60.00 mL/min 64.81  ? ?Results for [name], [name] (MRN [id number]) as of 12/01/2020 09:33 ? Ref. Range 11/20/2020 07:32  ?TSH  Latest Ref Range: 0.35 - 5.50 uIU/mL 3.13  ?Triiodothyronine,Free,Serum Latest Ref Range: 2.3 - 4.2 pg/mL 3.0  ?T4,Free(Direct) Latest Ref Range: 0.60 - 1.60 ng/dL 0.98  ? ?Thyroid ultrasound 12/08/2020 ? ?FINDINGS: ?Parenchymal Echotexture: Mildly heterogeneous ?  ?Isthmus: 0.8 cm ?  ?  Right lobe: 6.3 x 1.9 x 2.6 cm ?  ?Left lobe: 7.0 x 2.7 x 3.1 cm ?  ?_________________________________________________________ ?  ?Estimated total number of nodules >/= 1 cm: 4 ?  ?Number of spongiform nodules >/=  2 cm not described below (TR1): 0 ?  ?Number of mixed cystic and solid nodules >/= 1.5 cm not described ?below ([place]): 0 ?  ?_________________________________________________________ ?  ?Nodule # 1: ?  ?Location: Isthmus; superior ?  ?Maximum size: 1.4 cm; Other 2 dimensions: 1.0 x 0.6 cm ?  ?Composition: solid/almost completely solid (2) ?  ?Echogenicity: hypoechoic (2) ?  ?Shape: not taller-than-wide (0) ?  ?Margins: smooth (0) ?  ?Echogenic foci: none (0) ?  ?ACR TI-RADS total points: 4. ?  ?ACR TI-RADS risk category: TR4 (4-6 points). ?  ?ACR TI-RADS recommendations: ?  ?*Given size (>/= 1 - 1.4 cm) and appearance, a follow-up ultrasound ?in 1 year should be considered based on TI-RADS criteria. ?  ?_________________________________________________________ ?  ?Nodule 2: 1.0 x 0.9 x 0.3 cm predominantly cystic nodule in the mid ?isthmus does not meet criteria for FNA or imaging surveillance. ?  ?_________________________________________________________ ?  ?Nodule # 3: ?  ?Location: Isthmus; inferior ?  ?Maximum size: 0.8 cm; Other 2 dimensions: 0.5 x 0.6 cm ?  ?Composition: cystic/almost completely cystic (0) ?  ?Echogenicity: anechoic (0) ?  ?Shape: not taller-than-wide (0) ?  ?Margins: smooth (0) ?  ?Echogenic foci: peripheral calcifications (2) ?  ?ACR TI-RADS total points: 2. ?  ?ACR TI-RADS risk category: TR2 (2 points). ?  ?ACR TI-RADS recommendations: ?  ?This nodule does NOT meet TI-RADS criteria for biopsy or  dedicated ?follow-up. ?  ?_________________________________________________________ ?  ?Nodule 4: 0.9 x 0.6 x 0.8 cm isoechoic nodule in the mid right ?thyroid lobe does not meet criteria for imaging surveillanc

## 2021-04-22 NOTE — Patient Instructions (Signed)
?-   Continue  Glipizide 10 mg, one and a half tablets  before Breakfast and 1 tablet before Supper  ?- Will switch Farxiga to [place] 25 mg, 1 tablet every morning  ? ? ? ?HOW TO TREAT LOW BLOOD SUGARS (Blood sugar LESS THAN 70 MG/DL) ?Please follow the RULE OF 15 for the treatment of hypoglycemia treatment (when your (blood sugars are less than 70 mg/dL)  ? ?STEP 1: Take 15 grams of carbohydrates when your blood sugar is low, which includes:  ?3-4 GLUCOSE TABS  OR ?3-4 OZ OF JUICE OR REGULAR SODA OR ?ONE TUBE OF GLUCOSE GEL   ? ?STEP 2: RECHECK blood sugar in 15 MINUTES ?STEP 3: If your blood sugar is still low at the 15 minute recheck --> then, go back to STEP 1 and treat AGAIN with another 15 grams of carbohydrates. ? ?

## 2021-05-18 ENCOUNTER — Other Ambulatory Visit: Payer: Self-pay | Admitting: Cardiology

## 2021-05-21 ENCOUNTER — Encounter: Payer: Self-pay | Admitting: Family

## 2021-05-21 ENCOUNTER — Ambulatory Visit (INDEPENDENT_AMBULATORY_CARE_PROVIDER_SITE_OTHER): Payer: 59 | Admitting: Family

## 2021-05-21 ENCOUNTER — Other Ambulatory Visit (HOSPITAL_COMMUNITY)
Admission: RE | Admit: 2021-05-21 | Discharge: 2021-05-21 | Disposition: A | Discharge status: Home / Self Care | Payer: 59 | Source: Ambulatory Visit | Attending: Family | Admitting: Family

## 2021-05-21 ENCOUNTER — Telehealth: Payer: Self-pay | Admitting: Family

## 2021-05-21 VITALS — BP 116/77 | HR 56 | Temp 98.0°F | Ht 69.0 in | Wt 200.0 lb

## 2021-05-21 DIAGNOSIS — E042 Nontoxic multinodular goiter: Secondary | ICD-10-CM

## 2021-05-21 DIAGNOSIS — Z0000 Encounter for general adult medical examination without abnormal findings: Secondary | ICD-10-CM

## 2021-05-21 DIAGNOSIS — E785 Hyperlipidemia, unspecified: Secondary | ICD-10-CM | POA: Diagnosis not present

## 2021-05-21 DIAGNOSIS — I480 Paroxysmal atrial fibrillation: Secondary | ICD-10-CM | POA: Diagnosis not present

## 2021-05-21 DIAGNOSIS — E010 Iodine-deficiency related diffuse (endemic) goiter: Secondary | ICD-10-CM

## 2021-05-21 DIAGNOSIS — E1165 Type 2 diabetes mellitus with hyperglycemia: Secondary | ICD-10-CM

## 2021-05-21 LAB — LIPID PANEL
Cholesterol: 155 mg/dL (ref 0–200)
HDL: 47.4 mg/dL (ref >39.00)
LDL Cholesterol: 82 mg/dL (ref 0–99)
NonHDL: 107.31
Total CHOL/HDL Ratio: 3
Triglycerides: 129 mg/dL (ref 0.0–149.0)
VLDL: 25.8 mg/dL (ref 0.0–40.0)

## 2021-05-21 LAB — COMPREHENSIVE METABOLIC PANEL
ALT: 14 U/L (ref 0–35)
AST: 15 U/L (ref 0–37)
Albumin: 4.1 g/dL (ref 3.5–5.2)
Alkaline Phosphatase: 73 U/L (ref 39–117)
BUN: 16 mg/dL (ref 6–23)
CO2: 28 mEq/L (ref 19–32)
Calcium: 9.3 mg/dL (ref 8.4–10.5)
Chloride: 106 mEq/L (ref 96–112)
Creatinine, Ser: 0.91 mg/dL (ref 0.40–1.20)
GFR: 69.72 mL/min (ref >60.00)
Glucose, Bld: 91 mg/dL (ref 70–99)
Potassium: 3.9 mEq/L (ref 3.5–5.1)
Sodium: 141 mEq/L (ref 135–145)
Total Bilirubin: 0.3 mg/dL (ref 0.2–1.2)
Total Protein: 7.7 g/dL (ref 6.0–8.3)

## 2021-05-21 LAB — TSH: TSH: 0.61 u[IU]/mL (ref 0.35–5.50)

## 2021-05-21 NOTE — Telephone Encounter (Signed)
Records request faxed to [facility] ?

## 2021-05-21 NOTE — Assessment & Plan Note (Signed)
Will need follow up US in 11/23.  ?

## 2021-05-21 NOTE — Assessment & Plan Note (Addendum)
Discussed healthy diet, exercise, weight loss. [place] up to date. Pap performed today. Encouraged her to seek out bivalent covid exam. Mammogram up to date.  ?

## 2021-05-21 NOTE — Progress Notes (Signed)
? ?Subjective:  ? ?By signing my name below, I, Carylon Perches, attest that this documentation has been prepared under the direction and in the presence of Debbrah Alar NP, 05/21/2021 ? ? Patient ID: Sarah Ibarra, female    DOB: Mar 18, 1965, 56 y.o.   MRN: [id number] ? ?Chief Complaint  ?Patient presents with  ? Annual Exam  ?  CPE- fasting   ? ? ?HPI ?Patient is in today for a comprehensive physical exam. ? ?Bowel Movements - She reports having a bowel movement every other day. She states at one point she didn't have a bowel movement for two days.  ? ?She denies having any fever, ear pain, new muscle pain, joint pain, new moles, congestion, sinus pain, sore throat, palpations, wheezing, n/v/d, constipation, blood in stool, dysuria, frequency, hematuria, headaches, depresssion or anxiety at this time. ? ?Social History: She reports no recent surgeries. She denies any changes to her family medical history.  ?Colonoscopy: Last completed on 04/05/2019 ?Dexa: Last completed on 11/05/2018 ?Pap Smear: Last completed on 10/05/2018. She is interested in receiving a pap smear during today's visit.  ?Mammogram: Last completed on 05/16/2021 ?Immunizations: UTD on pneumonia, tetanus, shingles and influenza vaccinations. She has not taken the COVID 19 Bivalent vaccine. ?Diet: She is not eating a healthy diet. She states that she tried to implement a healthy diet but it failed.  ?Exercise: She is not regularly exercising. ?Dental: She is not UTD on dental exams. She has Designer, fashion/clothing.  ?Vision: She is UTD on vision exams.  ? ? ?Health Maintenance Due  ?Topic Date Due  ? COVID-19 Vaccine (3 - Booster for Pfizer series) 07/04/2019  ? OPHTHALMOLOGY EXAM  10/31/2020  ? URINE MICROALBUMIN  05/26/2021  ? ? ?Past Medical History:  ?Diagnosis Date  ? Arthritis   ? low back pain  ? Chicken pox as a child  ? Depression with anxiety   ? pt denies  ? Diabetes mellitus without complication ([place])   ? Frequent headaches   ? Hx: UTI  (urinary tract infection)   ? Hyperlipidemia   ? Hyperthyroidism 10/08/2018  ? Migraines   ? Mumps as a child  ? Overweight   ? UTI (urinary tract infection)   ? ? ?Past Surgical History:  ?Procedure Laterality Date  ? CARDIOVERSION  12/13/2018  ? [place]  ? TUBAL LIGATION  1993  ? ? ?Family History  ?Problem Relation Age of Onset  ? Other Mother   ?     low blood pressure  ? Hyperlipidemia Mother   ? Multiple sclerosis Son   ? Stroke Maternal Grandmother   ? Diabetes Maternal Grandmother   ?     type 2  ? Alzheimer's disease Paternal Grandfather   ? Colon cancer Neg Hx   ? Colon polyps Neg Hx   ? Esophageal cancer Neg Hx   ? Stomach cancer Neg Hx   ? Rectal cancer Neg Hx   ? ? ?Social History  ? ?Socioeconomic History  ? Marital status: Single  ?  Spouse name: Not on file  ? Number of children: 3  ? Years of education: Not on file  ? Highest education level: Not on file  ?Occupational History  ? Occupation: Engineer, maintenance at [employer]  ?Tobacco Use  ? Smoking status: Never  ? Smokeless tobacco: Never  ?Vaping Use  ? Vaping Use: Never used  ?Substance and Sexual Activity  ? Alcohol use: No  ? Drug use: No  ? Sexual  activity: Not Currently  ?  Partners: Male  ?  Comment: lives with mother  ?Other Topics Concern  ? Not on file  ?Social History Narrative  ? 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 Single, never married  ? 3 children (2 daughters local) Son in [place], 5 grandchildren  ? Lives with her mother who has a dog  ? Enjoys writing  ? ?Social Determinants of Health  ? ?Financial Resource Strain: Not on file  ?Food Insecurity: Not on file  ?Transportation Needs: Not on file  ?Physical Activity: Not on file  ?Stress: Not on file  ?Social Connections: Not on file  ?Intimate Partner Violence: Not on file  ? ? ?Outpatient Medications Prior to Visit  ?Medication Sig Dispense Refill  ? atorvastatin (LIPITOR) 10 MG tablet TAKE 1 TABLET(10 MG) BY MOUTH DAILY 90 tablet 3  ?  empagliflozin (JARDIANCE) 25 MG TABS tablet Take 1 tablet (25 mg total) by mouth daily before breakfast. 90 tablet 3  ? glipiZIDE (GLUCOTROL) 10 MG tablet Take 1.5 tablets (15 mg total) by mouth daily before breakfast AND 1 tablet (10 mg total) daily before supper. 225 tablet 3  ? metoprolol tartrate (LOPRESSOR) 25 MG tablet TAKE 1 TABLET BY MOUTH TWICE  DAILY 180 tablet 0  ? ?No facility-administered medications prior to visit.  ? ? ?Allergies  ?Allergen Reactions  ? Morphine And Related Hives  ? ? ?Review of Systems  ?Constitutional:  Negative for fever.  ?HENT:  Negative for congestion, ear pain, sinus pain and sore throat.   ?Respiratory:  Negative for wheezing.   ?Cardiovascular:  Negative for palpitations.  ?Gastrointestinal:  Negative for blood in stool, constipation, diarrhea, nausea and vomiting.  ?Genitourinary:  Negative for dysuria, frequency and hematuria.  ?Musculoskeletal:  Negative for joint pain and myalgias.  ?Skin:   ?     (-) [place]  ?Neurological:  Negative for headaches.  ?Psychiatric/Behavioral:  Negative for depression. The patient is not nervous/anxious.   ? ?   ?Objective:  ?  ?Physical Exam ?Constitutional:   ?   General: She is not in acute distress. ?   Appearance: Normal appearance. She is not ill-appearing.  ?HENT:  ?   Head: Normocephalic and atraumatic.  ?   Right Ear: Tympanic membrane, ear canal and external ear normal.  ?   Left Ear: Tympanic membrane, ear canal and external ear normal.  ?Eyes:  ?   Extraocular Movements: Extraocular movements intact.  ?   Pupils: Pupils are equal, round, and reactive to light.  ?Neck:  ?   Thyroid: Thyromegaly present.  ?Cardiovascular:  ?   Rate and Rhythm: Normal rate and regular rhythm.  ?   Heart sounds: Normal heart sounds. No murmur heard. ?  No gallop.  ?Pulmonary:  ?   Effort: Pulmonary effort is normal. No respiratory distress.  ?   Breath sounds: Normal breath sounds. No wheezing or rales.  ?Chest:  ?Breasts: ?   Breasts are  symmetrical.  ?   Right: No inverted nipple or mass.  ?   Left: No inverted nipple or mass.  ?Abdominal:  ?   General: Bowel sounds are normal. There is no distension.  ?   Palpations: Abdomen is soft.  ?   Tenderness: There is no abdominal tenderness. There is no guarding.  ?Genitourinary: ?   General: Normal vulva.  ?   Vagina: Normal.  ?   Cervix: Normal.  ?   Uterus: Normal.   ?  Adnexa: Right adnexa normal and left adnexa normal.  ?Musculoskeletal:  ?   Comments: 5/5 strength in both upper and lower extremities  ?Skin: ?   General: Skin is warm and dry.  ?Neurological:  ?   Mental Status: She is alert and oriented to person, place, and time.  ?   Deep Tendon Reflexes:  ?   Reflex Scores: ?     Patellar reflexes are 2+ on the right side and 2+ on the left side. ?Psychiatric:     ?   Mood and Affect: Mood normal.     ?   Behavior: Behavior normal.     ?   Judgment: Judgment normal.  ? ? ?BP 116/77   Pulse (!) 56   Temp 98 ?F (36.7 ?C) (Oral)   Ht _0  (1.753 m)   Wt 200 lb (90.7 kg)   LMP 04/06/2012   SpO2 99%   BMI 29.53 kg/m?  ?Wt Readings from Last 3 Encounters:  ?05/21/21 200 lb (90.7 kg)  ?04/22/21 199 lb (90.3 kg)  ?12/01/20 197 lb (89.4 kg)  ? ? ?   ?Assessment & Plan:  ? ?Problem List Items Addressed This Visit   ? ?  ? Unprioritized  ? Type 2 diabetes mellitus with hyperglycemia, without long-term current use of insulin ([place])  ? Relevant Orders  ? Comp Met (CMET)  ? Thyromegaly  ? Relevant Orders  ? TSH  ? Preventative health care - Primary  ?  Discussed healthy diet, exercise, weight loss. Colo up to date. Pap performed today. Encouraged her to seek out bivalent covid exam. Mammogram up to date.  ? ?  ?  ? Relevant Orders  ? Cytology - PAP( [place])  ? PAF (paroxysmal atrial fibrillation) ([place])  ?  Rate stable. Encouraged pt to schedule an annual follow up appointment with cardiology.  ? ?  ?  ? Multinodular goiter  ?  Will need follow up US in 11/23.  ? ?  ?  ? Dyslipidemia  ? Relevant  Orders  ? Lipid panel  ? ? ? ? ?No orders of the defined types were placed in this encounter. ? ? ?I, Nance Pear, NP, personally preformed the services described in this documentation.  All medical record entries made by

## 2021-05-21 NOTE — Assessment & Plan Note (Signed)
Rate stable. Encouraged pt to schedule an annual follow up appointment with cardiology.  ?

## 2021-05-21 NOTE — Telephone Encounter (Signed)
Please call [facility] and request eye exam. ?

## 2021-05-21 NOTE — Patient Instructions (Signed)
Please schedule a routine dental exam. ?Try to add 30 minutes of exercise 5 days a week. ? ?

## 2021-05-27 LAB — CYTOLOGY - PAP
Comment: NEGATIVE
Diagnosis: NEGATIVE
Diagnosis: REACTIVE
High risk HPV: NEGATIVE

## 2021-06-24 ENCOUNTER — Other Ambulatory Visit: Payer: Self-pay | Admitting: Cardiology

## 2021-07-06 ENCOUNTER — Other Ambulatory Visit: Payer: Self-pay

## 2021-07-06 MED ORDER — ATORVASTATIN CALCIUM 10 MG PO TABS: 10.0000 mg | ORAL_TABLET | Freq: Every day | ORAL | 0 refills | Status: DC

## 2021-08-01 ENCOUNTER — Other Ambulatory Visit: Payer: Self-pay | Admitting: Cardiology

## 2021-08-02 MED ORDER — ATORVASTATIN CALCIUM 10 MG PO TABS: 10.0000 mg | ORAL_TABLET | Freq: Every day | ORAL | 0 refills | Status: DC

## 2021-08-11 ENCOUNTER — Other Ambulatory Visit: Payer: Self-pay | Admitting: Cardiology

## 2021-08-17 ENCOUNTER — Other Ambulatory Visit: Payer: Self-pay | Admitting: Cardiology

## 2021-09-07 ENCOUNTER — Emergency Department (HOSPITAL_BASED_OUTPATIENT_CLINIC_OR_DEPARTMENT_OTHER): Payer: 59

## 2021-09-07 ENCOUNTER — Emergency Department (HOSPITAL_BASED_OUTPATIENT_CLINIC_OR_DEPARTMENT_OTHER)
Admission: EM | Admit: 2021-09-07 | Discharge: 2021-09-07 | Disposition: A | Discharge status: Home / Self Care | Payer: 59 | Attending: Emergency Medicine | Admitting: Emergency Medicine

## 2021-09-07 ENCOUNTER — Encounter (HOSPITAL_BASED_OUTPATIENT_CLINIC_OR_DEPARTMENT_OTHER): Payer: Self-pay | Admitting: Emergency Medicine

## 2021-09-07 ENCOUNTER — Other Ambulatory Visit: Payer: Self-pay

## 2021-09-07 DIAGNOSIS — R42 Dizziness and giddiness: Secondary | ICD-10-CM

## 2021-09-07 DIAGNOSIS — I480 Paroxysmal atrial fibrillation: Secondary | ICD-10-CM | POA: Insufficient documentation

## 2021-09-07 DIAGNOSIS — E119 Type 2 diabetes mellitus without complications: Secondary | ICD-10-CM | POA: Diagnosis not present

## 2021-09-07 LAB — BASIC METABOLIC PANEL
Anion gap: 8 (ref 5–15)
BUN: 17 mg/dL (ref 6–20)
CO2: 25 mmol/L (ref 22–32)
Calcium: 9.6 mg/dL (ref 8.9–10.3)
Chloride: 103 mmol/L (ref 98–111)
Creatinine, Ser: 0.79 mg/dL (ref 0.44–1.00)
GFR, Estimated: >60 mL/min (ref >60)
Glucose, Bld: 239 mg/dL — ABNORMAL HIGH (ref 70–99)
Potassium: 4 mmol/L (ref 3.5–5.1)
Sodium: 136 mmol/L (ref 135–145)

## 2021-09-07 LAB — URINALYSIS, ROUTINE W REFLEX MICROSCOPIC
Bilirubin Urine: NEGATIVE
Glucose, UA: NEGATIVE mg/dL
Hgb urine dipstick: NEGATIVE
Ketones, ur: 15 mg/dL — AB
Leukocytes,Ua: NEGATIVE
Nitrite: NEGATIVE
Protein, ur: NEGATIVE mg/dL
Specific Gravity, Urine: >=1.03 (ref 1.005–1.030)
pH: 5.5 (ref 5.0–8.0)

## 2021-09-07 LAB — TROPONIN I (HIGH SENSITIVITY): Troponin I (High Sensitivity): 5 ng/L (ref <18)

## 2021-09-07 LAB — CBG MONITORING, ED: Glucose-Capillary: 171 mg/dL — ABNORMAL HIGH (ref 70–99)

## 2021-09-07 LAB — CBC
HCT: 39.7 % (ref 36.0–46.0)
Hemoglobin: 12.7 g/dL (ref 12.0–15.0)
MCH: 25.8 pg — ABNORMAL LOW (ref 26.0–34.0)
MCHC: 32 g/dL (ref 30.0–36.0)
MCV: 80.5 fL (ref 80.0–100.0)
Platelets: 322 10*3/uL (ref 150–400)
RBC: 4.93 MIL/uL (ref 3.87–5.11)
RDW: 12.2 % (ref 11.5–15.5)
WBC: 5 10*3/uL (ref 4.0–10.5)
nRBC: 0 % (ref 0.0–0.2)

## 2021-09-07 LAB — PREGNANCY, URINE: Preg Test, Ur: NEGATIVE

## 2021-09-07 LAB — MAGNESIUM: Magnesium: 1.8 mg/dL (ref 1.7–2.4)

## 2021-09-07 MED ORDER — LACTATED RINGERS IV BOLUS
1000.0000 mL | Freq: Once | INTRAVENOUS | Status: AC
  Administered 2021-09-07: 1000 mL via INTRAVENOUS

## 2021-09-07 NOTE — ED Provider Notes (Signed)
[facility] EMERGENCY DEPARTMENT Provider Note   CSN: [id number] Arrival date & time: 09/07/21  1333     History  Chief Complaint  Patient presents with   Dizziness    Sarah Ibarra is a 56 y.o. female with a history of atrial fibrillation, diabetes, vertigo who presents for complaint of dizziness associated with the sensation of shortness of breath.  Started suddenly around noon today when she was washing dishes.  It is described as lightheadedness, or "fuzzy headedness".  It was associated with a sensation of shortness of breath, patient felt like she was gasping for air.  It was also associated with some shakiness.  She says that this used to occur when she had A-fib.  She thought she may have had A-fib today.  Her symptoms do not seem to be positional, for instance they are not worsened going from sitting to standing as far she can tell.  Today's episode episode is different from her episodes of vertigo which usually last all day and only go after she lays down in a dark room.  Review of systems notable for chest pain couple of days ago when she was at work.  The pain was sudden in onset and sharp.  It lasted for a few hours before going away on its own.  It was nonradiating, localized to left chest.  It was associated with shortness of breath and a "hot flash".  She cannot identify any alleviating or exacerbating factors.  Patient also reports feeling "not herself" for the last couple of weeks.  She reports high blood sugar which is unusual for her.  She says that she has not been able to get her Jardiance due to cost.  Review of systems negative for fevers, abdominal pain, nausea, vomiting, diarrhea, changes to appetite, visual changes including blurry vision, dark or tunnel vision, spots.   Dizziness      Home Medications Prior to Admission medications   Medication Sig Start Date End Date Taking? Authorizing Provider  atorvastatin (LIPITOR) 10 MG tablet Take 1 tablet (10  mg total) by mouth daily. Please call our office to schedule an overdue appointment with Dr. Mayford Knife before anymore refills. [phone]. Thank you 3rd attempt 08/02/21   Quintella Reichert, MD  empagliflozin (JARDIANCE) 25 MG TABS tablet Take 1 tablet (25 mg total) by mouth daily before breakfast. 04/22/21   Shamleffer, Konrad Dolores, MD  glipiZIDE (GLUCOTROL) 10 MG tablet Take 1.5 tablets (15 mg total) by mouth daily before breakfast AND 1 tablet (10 mg total) daily before supper. 12/01/20   Shamleffer, Konrad Dolores, MD  metoprolol tartrate (LOPRESSOR) 25 MG tablet TAKE 1 TABLET BY MOUTH TWICE  DAILY 08/12/21   Quintella Reichert, MD      Allergies    Morphine and related    Review of Systems   Review of Systems  Neurological:  Positive for dizziness.  All other systems reviewed and are negative.   Physical Exam Updated Vital Signs BP 102/61 (BP Location: Left Arm)   Pulse 80   Temp 98.4 F (36.9 C) (Oral)   Resp 17   Ht 5\' 9"  (1.753 m)   Wt 88.5 kg   LMP 04/06/2012   SpO2 99%   BMI 28.80 kg/m  Physical Exam Vitals and nursing note reviewed.  Constitutional:      General: She is not in acute distress.    Appearance: She is well-developed.  HENT:     Head: Normocephalic and atraumatic.  Eyes:  Extraocular Movements: Extraocular movements intact.     Conjunctiva/sclera: Conjunctivae normal.     Pupils: Pupils are equal, round, and reactive to light.  Cardiovascular:     Rate and Rhythm: Normal rate and regular rhythm.     Heart sounds: No murmur heard. Pulmonary:     Effort: Pulmonary effort is normal. No respiratory distress.     Breath sounds: Normal breath sounds.  Abdominal:     Palpations: Abdomen is soft.     Tenderness: There is no abdominal tenderness.  Musculoskeletal:        General: No swelling.     Cervical back: Neck supple.  Skin:    General: Skin is warm and dry.     Capillary Refill: Capillary refill takes less than 2 seconds.  Neurological:      General: No focal deficit present.     Mental Status: She is alert.     Comments: Negative Dix-Hallpike  Psychiatric:        Mood and Affect: Mood normal.     ED Results / Procedures / Treatments   Labs (all labs ordered are listed, but only abnormal results are displayed) Labs Reviewed  BASIC METABOLIC PANEL - Abnormal; Notable for the following components:      Result Value   Glucose, Bld 239 (*)    All other components within normal limits  CBC - Abnormal; Notable for the following components:   MCH 25.8 (*)    All other components within normal limits  URINALYSIS, ROUTINE W REFLEX MICROSCOPIC - Abnormal; Notable for the following components:   APPearance HAZY (*)    Ketones, ur 15 (*)    All other components within normal limits  CBG MONITORING, ED - Abnormal; Notable for the following components:   Glucose-Capillary 171 (*)    All other components within normal limits  PREGNANCY, URINE  MAGNESIUM  TSH  TROPONIN I (HIGH SENSITIVITY)  TROPONIN I (HIGH SENSITIVITY)    EKG EKG Interpretation  Date/Time:  Tuesday September 07 2021 13:51:00 EDT Ventricular Rate:  80 PR Interval:  168 QRS Duration: 82 QT Interval:  384 QTC Calculation: 442 R Axis:   58 Text Interpretation: Normal sinus rhythm Right atrial enlargement Borderline ECG When compared with ECG of 13-Dec-2018 14:35, PREVIOUS ECG IS PRESENT when compared to prior, similar appeance. No STEMI Confirmed by Theda Belfast ([id number]) on 09/07/2021 5:32:54 PM  Radiology DG Chest Port 1 View  Result Date: 09/07/2021 CLINICAL DATA:  Chest pain. EXAM: PORTABLE CHEST 1 VIEW COMPARISON:  Chest radiograph dated 12/13/2018. FINDINGS: No focal consolidation, pleural effusion or pneumothorax. The cardiac silhouette is within normal limits. No acute osseous pathology IMPRESSION: No active disease. Electronically Signed   By: Elgie Collard M.D.   On: 09/07/2021 19:08    Procedures Procedures    Medications Ordered in  ED Medications  lactated ringers bolus 1,000 mL (0 mLs Intravenous Stopped 09/07/21 2027)    ED Course/ Medical Decision Making/ A&P Clinical Course as of 09/07/21 2356  Tue Sep 07, 2021  1934 Magnesium: 1.8 [MM]  1934 Troponin I (High Sensitivity): 5 [MM]  1934 DG Chest Port 1 View Chest x-ray normal.  Nothing to explain the patient's current clinical syndrome. [MM]    Clinical Course User Index [MM] Marrianne Mood, MD                           Medical Decision Making Amount and/or Complexity of Data Reviewed Labs:  ordered. Decision-making details documented in ED Course. Radiology: ordered. Decision-making details documented in ED Course.   This patient is a 56 year old female with a history of atrial fibrillation and diabetes who presents after an episode of lightheadedness, shortness of breath without chest pain.  She is asymptomatic at the time of my evaluation.  She is without chest pain shortness of breath currently.  History is notable for a suspicious episode of chest pain associated with shortness of breath and diaphoresis few days ago that resolved on its own, thus she did not seek medical care at that time.  Her exam is reassuring.  She has no focal neurologic deficits.  I cannot elicit vertigo with the Dix-Hallpike maneuver.  I do not hear carotid bruits.  She is not in atrial fibrillation.  She is overall well-appearing.  Laboratory work-up notable for hyperglycemia and some ketones in the urine.  My impression is that this patient may have suffered an episode of paroxysmal atrial fibrillation.  She may also be hypovolemic.  Based on the description of her symptoms I do not think she is having an episode of vertigo.  I am not suspicious for central neurologic pathology like stroke or carotid artery disease causing TIA.  I will follow her initial laboratory work-up with troponin and magnesium.  We will also obtain TSH given the patient's history of Graves' disease.  I will order  a chest x-ray.   Co morbidities that complicate the patient evaluation  History of paroxysmal atrial fibrillation Diabetes   Social Determinants of Health:  Trouble affording medications   Additional history obtained:  Additional history and/or information obtained from chart review and daughter at bedside External records from outside source obtained and reviewed including charts from prior healthcare encounters   Lab Tests:  I Ordered (or co-signed), and personally interpreted labs.  The pertinent results include: Troponin within normal limits.  Magnesium within normal limits.  No significant electrolyte abnormalities.  UA normal with exception of some ketones.   Imaging Studies ordered:  I ordered (or co-signed) imaging studies including chest x-ray I independently visualized and interpreted imaging which showed no acute abnormalities to explain the patient's current clinical syndrome I agree with the radiologist interpretation   Cardiac Monitoring:  The patient was maintained on a cardiac monitor.  The cardiac monitored showed an rhythm of normal sinus rhythm. The patient was also maintained on pulse oximetry. The readings were typically within normal limits.   Medicines ordered and prescription drug management:  I ordered medication including LR 1000 mL IV Reevaluation of the patient after these medicines showed that the patient improved  Reevaluation:  After the interventions noted above, I reevaluated the patient and found that they have :improved.    Dispostion:  After consideration of the diagnostic results and the patients response to treatment, I feel that the patent would benefit from discharge home and referral to cardiology for evaluation of intermittent lightheadedness in the setting of history of paroxysmal atrial fibrillation.          Final Clinical Impression(s) / ED Diagnoses Final diagnoses:  Intermittent lightheadedness  PAF  (paroxysmal atrial fibrillation) ([facility])    Rx / DC Orders ED Discharge Orders          Ordered    Ambulatory referral to Cardiology       Comments: If you have not heard from the Cardiology office within the next 72 hours please call [phone].   09/07/21 2054  Marrianne Mood, MD 09/07/21 2356    Tegeler, Canary Brim, MD 09/09/21 1351

## 2021-09-07 NOTE — ED Notes (Signed)
Pt requested some crackers to eat, ok per RN Tiffany pt given graham crackers and some water to drink

## 2021-09-07 NOTE — ED Triage Notes (Signed)
Reports was running errands , felt dizzy , short of breath . Denies chest pain . Hx afib. Took her off of blood thinner last year .

## 2021-09-07 NOTE — Discharge Instructions (Signed)
Your evaluated in the emergency department for dizziness.  The results of your evaluation are reassuring.  I do not think that your dizziness was caused by an acute, life-threatening emergency.  It is possible that he had a brief episode of atrial fibrillation that we did not detect during her stay in the emergency department.  It is also possible that you are dehydrated.  I recommend following up with your cardiologist as soon as possible after you leave the hospital.  Please return to the emergency department if you experience signs or symptoms of severe illness including, but not limited to: Chest pain, shortness of breath, fainting, confusion, fevers greater than 100.4 F.

## 2021-09-08 ENCOUNTER — Telehealth: Payer: Self-pay

## 2021-09-08 NOTE — Telephone Encounter (Signed)
Quintella Reichert, MD  Theresia Majors, RN Please get a 2-week Zio patch for the patient for atrial fibrillation and then follow-up after that in A-fib clinic

## 2021-09-08 NOTE — Telephone Encounter (Signed)
Left message for patient to call back  

## 2021-09-10 ENCOUNTER — Other Ambulatory Visit: Payer: Self-pay | Admitting: Cardiology

## 2021-09-15 ENCOUNTER — Ambulatory Visit: Payer: 59 | Attending: Cardiology | Admitting: Cardiology

## 2021-09-15 ENCOUNTER — Encounter: Payer: Self-pay | Admitting: Cardiology

## 2021-09-15 VITALS — BP 118/70 | HR 82 | Ht 69.0 in | Wt 190.2 lb

## 2021-09-15 DIAGNOSIS — E7800 Pure hypercholesterolemia, unspecified: Secondary | ICD-10-CM | POA: Diagnosis not present

## 2021-09-15 DIAGNOSIS — I480 Paroxysmal atrial fibrillation: Secondary | ICD-10-CM

## 2021-09-15 DIAGNOSIS — E0590 Thyrotoxicosis, unspecified without thyrotoxic crisis or storm: Secondary | ICD-10-CM

## 2021-09-15 MED ORDER — ATORVASTATIN CALCIUM 10 MG PO TABS: 10.0000 mg | ORAL_TABLET | Freq: Every day | ORAL | 3 refills | Status: DC

## 2021-09-15 MED ORDER — METOPROLOL TARTRATE 25 MG PO TABS: 25.0000 mg | ORAL_TABLET | Freq: Two times a day (BID) | ORAL | 3 refills | Status: AC

## 2021-09-15 NOTE — Addendum Note (Signed)
Addended by: Theresia Majors on: 09/15/2021 08:38 AM   Modules accepted: Orders

## 2021-09-15 NOTE — Progress Notes (Signed)
Cardiology Office Note:    Date:  09/15/2021   ID:  Sarah Ibarra, DOB 1965-12-16, MRN [id number]  PCP:  Sandford Craze, NP  Cardiologist:  Armanda Magic, MD    Referring MD: Sandford Craze, NP   Chief Complaint  Patient presents with   Hyperlipidemia   Atrial Fibrillation    History of Present Illness:    Sarah Ibarra is a 56 y.o. female with a hx of hyperlipidemia, PAFand anxiety.  She had new onset PAF in 11/2018 in setting of hyperthyroidism.  Rx'd with Cardizem and DCCV but reverted back to afib.  Started on BB and anticoagulation with Eliquis 5mg  BID for CHADS2-VASC score of 2.  She has maintained NSR since then.  2D echo showed normal LVF.    She is here today for followup and is doing well.  A few weeks ago she had some intermittent sharp stabbing pains for 20 minutes 1 day but that resolved.  Last week she had some mild SOB for a day and was seen in Urgent care and workup was normal except for dehydration.  She has not had any further SOB. She denies any  PND, orthopnea, LE edema, palpitations or syncope. She is compliant with her meds and is tolerating meds with no SE.    Past Medical History:  Diagnosis Date   Arthritis    low back pain   Chicken pox as a child   Depression with anxiety    pt denies   Diabetes mellitus without complication (HCC)    Frequent headaches    Hx: UTI (urinary tract infection)    Hyperlipidemia    Hyperthyroidism 10/08/2018   Migraines    Mumps as a child   Overweight    UTI (urinary tract infection)     Past Surgical History:  Procedure Laterality Date   CARDIOVERSION  12/13/2018   CESAREAN SECTION  1993   TUBAL LIGATION  1993    Current Medications: Current Meds  Medication Sig   glipiZIDE (GLUCOTROL) 10 MG tablet Take 1.5 tablets (15 mg total) by mouth daily before breakfast AND 1 tablet (10 mg total) daily before supper.   metoprolol tartrate (LOPRESSOR) 25 MG tablet TAKE 1 TABLET BY MOUTH TWICE  DAILY    [DISCONTINUED] atorvastatin (LIPITOR) 10 MG tablet Take 1 tablet (10 mg total) by mouth daily. Please call our office to schedule an overdue appointment with Dr. 12/15/2018 before anymore refills. [phone]. 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"   Single, never married   3 children (2 daughters local) Son in [place], 5 grandchildren   Lives with her mother who has a [place]   Enjoys Nurse, mental health   Social Determinants  of Health   Financial Resource Strain: Low Risk  (05/30/2017)   Overall Financial Resource Strain (CARDIA)    Difficulty of Paying Living Expenses: Not hard at all  Food Insecurity: No Food Insecurity (05/30/2017)   Hunger Vital Sign    Worried About Running Out of Food in the Last Year: Never true    Ran Out of Food in the Last Year: Never true  Transportation Needs: No Transportation Needs (05/30/2017)   PRAPARE - Administrator, Civil Service (Medical): No    Lack of Transportation (Non-Medical): No  Physical Activity: Sufficiently Active (05/30/2017)   Exercise Vital Sign    Days of Exercise per Week: 3 days    Minutes of Exercise per Session: 60 min  Stress: No Stress Concern Present (05/30/2017)   [employer] of Occupational Health - Occupational Stress Questionnaire    Feeling of Stress : Not at all   Social Connections: Unknown (05/30/2017)   Social Connection and Isolation Panel [NHANES]    Frequency of Communication with Friends and Family: More than three times a week    Frequency of Social Gatherings with Friends and Family: More than three times a week    Attends Religious Services: More than 4 times per year    Active Member of [employer] or Organizations: No    Attends Engineer, structural: Never    Marital Status: Not on file     Family History: The patient's family history includes Alzheimer's disease in her paternal grandfather; Diabetes in her maternal grandmother; Hyperlipidemia in her mother; Multiple sclerosis in her son; Other in her mother; Stroke in her maternal grandmother. There is no history of Colon cancer, Colon polyps, Esophageal cancer, Stomach cancer, or Rectal cancer.  ROS:   Please see the history of present illness.    ROS  All other systems reviewed and negative.   EKGs/Labs/Other Studies Reviewed:    The following studies were reviewed today:   EKG:  EKG is ordered today and demonstrates NSR with no ST changes  Recent Labs: 05/21/2021: ALT 14; TSH 0.61 09/07/2021: BUN 17; Creatinine, Ser 0.79; Hemoglobin 12.7; Magnesium 1.8; Platelets 322; Potassium 4.0; Sodium 136   Recent Lipid Panel    Component Value Date/Time   CHOL 155 05/21/2021 0734   TRIG 129.0 05/21/2021 0734   HDL 47.40 05/21/2021 0734   CHOLHDL 3 05/21/2021 0734   VLDL 25.8 05/21/2021 0734   LDLCALC 82 05/21/2021 0734    Physical Exam:    VS:  BP 118/70   Pulse 82   Ht 5\' 9"  (1.753 m)   Wt 190 lb 3.2 oz (86.3 kg)   LMP 04/06/2012   SpO2 98%   BMI 28.09 kg/m     Wt Readings from Last 3 Encounters:  09/15/21 190 lb 3.2 oz (86.3 kg)  09/07/21 195 lb (88.5 kg)  05/21/21 200 lb (90.7 kg)     ASSESSMENT:    1. PAF (paroxysmal atrial fibrillation) (HCC)   2. Hyperthyroidism   3. Pure hypercholesterolemia    PLAN:    In order of problems listed above:  1.   PAF -she is maintaining NSR on exam today and denies any palpitations -continue prescription drug management with Lopressor 25mg  BID with PRN refills -although her CHADS2VASC score is 2 (female and DM) her PAF occurred in setting of hyperthyroidism and therefore she is now off anticoagulation.    2.  Hyperthyroidism -per endocrine -this has resolved  3.  HLD -LDL goal <  100 -I have personally reviewed and interpreted outside labs performed by patient's PCP which showed LDL 82 and HDL 47 on 05/21/21 -continue prescription drug management with Lipitor 10mg  daily with PRN refills  followup with me in 1 year   Medication Adjustments/Labs and Tests Ordered: Current medicines are reviewed at length with the patient today.  Concerns regarding medicines are outlined above.  No orders of the defined types were placed in this encounter.  Meds ordered this encounter  Medications   atorvastatin (LIPITOR) 10 MG tablet    Sig: Take 1 tablet (10 mg total) by mouth daily.    Dispense:  90 tablet    Refill:  3    Signed, , MD  09/15/2021 8:33 AM    [facility]

## 2021-09-15 NOTE — Addendum Note (Signed)
Addended by: Sheppard Coil on: 09/15/2021 08:42 AM   Modules accepted: Orders

## 2021-09-15 NOTE — Patient Instructions (Signed)
Medication Instructions:  Your physician recommends that you continue on your current medications as directed. Please refer to the Current Medication list given to you today.  *If you need a refill on your cardiac medications before your next appointment, please call your pharmacy*  Follow-Up: At [facility], you and your health needs are our priority.  As part of our continuing mission to provide you with exceptional heart care, we have created designated Provider Care Teams.  These Care Teams include your primary Cardiologist (physician) and Advanced Practice Providers (APPs -  Physician Assistants and Nurse Practitioners) who all work together to provide you with the care you need, when you need it.  Your next appointment:   1 year(s)  The format for your next appointment:   In Person  Provider:   Traci Turner, MD     Important Information About Sugar       

## 2021-09-15 NOTE — Telephone Encounter (Signed)
Patient seen in office 8/30

## 2021-10-22 ENCOUNTER — Encounter: Payer: Self-pay | Admitting: Internal Medicine

## 2021-10-22 ENCOUNTER — Ambulatory Visit (INDEPENDENT_AMBULATORY_CARE_PROVIDER_SITE_OTHER): Payer: 59 | Admitting: Internal Medicine

## 2021-10-22 VITALS — BP 124/80 | HR 71 | Ht 69.0 in | Wt 192.2 lb

## 2021-10-22 DIAGNOSIS — E119 Type 2 diabetes mellitus without complications: Secondary | ICD-10-CM

## 2021-10-22 DIAGNOSIS — E042 Nontoxic multinodular goiter: Secondary | ICD-10-CM

## 2021-10-22 DIAGNOSIS — Z8639 Personal history of other endocrine, nutritional and metabolic disease: Secondary | ICD-10-CM | POA: Diagnosis not present

## 2021-10-22 LAB — POCT GLYCOSYLATED HEMOGLOBIN (HGB A1C): Hemoglobin A1C: 9.9 % — AB (ref 4.0–5.6)

## 2021-10-22 LAB — POCT GLUCOSE (DEVICE FOR HOME USE): Glucose Fasting, POC: 184 mg/dL — AB (ref 70–99)

## 2021-10-22 LAB — TSH: TSH: <0.01 u[IU]/mL — ABNORMAL LOW (ref 0.35–5.50)

## 2021-10-22 LAB — T4, FREE: Free T4: 1.55 ng/dL (ref 0.60–1.60)

## 2021-10-22 MED ORDER — RYBELSUS 7 MG PO TABS: 7.0000 mg | ORAL_TABLET | Freq: Every day | ORAL | 2 refills | Status: DC

## 2021-10-22 MED ORDER — GLIPIZIDE 10 MG PO TABS: 15.0000 mg | ORAL_TABLET | Freq: Two times a day (BID) | ORAL | 3 refills | Status: DC

## 2021-10-22 NOTE — Progress Notes (Signed)
Name: Sarah Ibarra  Age/ Sex: 56 y.o., female   MRN/ DOB: [id number], 1965-11-09     PCP: Sandford Craze, NP   Reason for Endocrinology Evaluation: Type 2 Diabetes Mellitus  Initial Endocrine Consultative Visit: 12/12/2018    Ibarra IDENTIFIER: Sarah Ibarra is a 56 y.o. female with a past medical history of T2DM and Dyslipidemia. Sarah Ibarra has followed with Endocrinology clinic since 12/12/2018 for consultative assistance with management of her diabetes.  DIABETIC HISTORY:  Sarah Ibarra was diagnosed with DM in 2009, she has been on Metformin since diagnosis.Her hemoglobin A1c has ranged from 8.3% in 2019, peaking at 12.5% in 2020 On her initial visit to our clinic she had an A1c of 12.5%, she declined insulin at Sarah time and opted for Glipizide. We stopped Metformin due to nausea   Farxiga started 01/2020 but stopped Farxiga 2023 due to cost of $1400, attempted to prescribe Jardiance April 2023   THYROID HISTORY:  Pt has been noted to have suppressed TSH < 0.01 uIU/mL with elevated FT4 3.11 ng/dL during routine workup. She was noted to have weight loss at Sarah time of her presentation but no other symptoms. She was started on Methimazole 11/2018 TRAB level slightly elevated at 4.91 IU/L    Mother with thyroid disease  SUBJECTIVE:   During Sarah last visit (04/22/2021): A1c 7.3 % . We increased glipizide and continued [place]      Today (10/22/2021): Sarah Ibarra is here for a follow up on diabetes.  She checks her blood sugars 1 times daily. Sarah Ibarra has not had hypoglycemic episodes since Sarah last clinic visit.   [place] is cost prohibitive  Denies nausea or  Diarrhea Denies  LE edema  Denies palpitations Has noted hand tremors  Denies local neck swelling   HOME ENDOCRINE REGIMEN:  Glipizide 10 mg, 1.5 tabs before breakfast and 1 tab before supper Jardiance 25 mg daily      Statin: Yes ACE-I/ARB: No    METER DOWNLOAD SUMMARY: [place]  90 day average  126  mg/dL  Low 85  High 366 Average 127 mg/dL    DIABETIC COMPLICATIONS: Microvascular complications:   Denies: CKD , neuropathy, retinopathy  Last Eye Exam: Completed  10/2020  Macrovascular complications:   Denies: CAD, CVA, PVD   HISTORY:  Past Medical History:  Past Medical History:  Diagnosis Date   Arthritis    low back pain   Chicken pox as a child   Depression with anxiety    pt denies   Diabetes mellitus without complication (HCC)    Frequent headaches    Hx: UTI (urinary tract infection)    Hyperlipidemia    Hyperthyroidism 10/08/2018   Migraines    Mumps as a child   Overweight    UTI (urinary tract infection)    Past Surgical History:  Past Surgical History:  Procedure Laterality Date   CARDIOVERSION  12/13/2018   CESAREAN SECTION  1993   TUBAL LIGATION  1993   Social History:  reports that she has never smoked. She has never used smokeless tobacco. She reports that she does not drink alcohol and does not use drugs. Family History:  Family History  Problem Relation Age of Onset   Other Mother        low blood pressure   Hyperlipidemia Mother    Multiple sclerosis Son    Stroke Maternal Grandmother    Diabetes Maternal Grandmother        type 2   Alzheimer's disease  Paternal Grandfather    Colon cancer Neg Hx    Colon polyps Neg Hx    Esophageal cancer Neg Hx    Stomach cancer Neg Hx    Rectal cancer Neg Hx      HOME MEDICATIONS: Allergies as of 10/22/2021       Reactions   Morphine And Related Hives        Medication List        Accurate as of October 22, 2021  7:15 AM. If you have any questions, ask your nurse or doctor.          atorvastatin 10 MG tablet Commonly known as: LIPITOR Take 1 tablet (10 mg total) by mouth daily.   empagliflozin 25 MG Tabs tablet Commonly known as: Jardiance Take 1 tablet (25 mg total) by mouth daily before breakfast.   glipiZIDE 10 MG tablet Commonly known as: GLUCOTROL Take 1.5  tablets (15 mg total) by mouth daily before breakfast AND 1 tablet (10 mg total) daily before supper.   metoprolol tartrate 25 MG tablet Commonly known as: LOPRESSOR Take 1 tablet (25 mg total) by mouth 2 (two) times daily.         OBJECTIVE:   Vital Signs: BP 124/80 (BP Location: Left Arm, Ibarra Position: Sitting, Cuff Size: Small)   Pulse 71   Ht 5\' 9"  (1.753 m)   Wt 192 lb 3.2 oz (87.2 kg)   LMP 04/06/2012   SpO2 96%   BMI 28.38 kg/m   Wt Readings from Last 3 Encounters:  09/15/21 190 lb 3.2 oz (86.3 kg)  09/07/21 195 lb (88.5 kg)  05/21/21 200 lb (90.7 kg)     Exam: General: Pt appears well and is in NAD  Neck: General: Supple without adenopathy. Thyroid: Prominent thyroid   Lungs: Clear with good BS   Heart: RRR   Abdomen: Normoactive bowel sounds, soft, nontender, without masses or organomegaly palpable  Extremities: No pretibial edema.   Neuro: MS is good with appropriate affect, pt is alert and Ox3    DM foot exam: 12/01/2020  Sarah skin of Sarah feet is without sores or ulcerations. Sarah pedal pulses are 2+ on right and 2+ on left. Sarah sensation is intact to a screening 5.07, 10 gram monofilament bilaterally    DATA REVIEWED:  Lab Results  Component Value Date   HGBA1C 7.3 (A) 04/22/2021   HGBA1C 7.7 (A) 12/01/2020   HGBA1C 6.9 (A) 05/26/2020    Latest Reference Range & Units 10/22/21 09:41  TSH 0.35 - 5.50 uIU/mL <0.01 (L)  Triiodothyronine (T3) 76 - 181 ng/dL 175  T4,Free(Direct) 0.60 - 1.60 ng/dL 1.55     Latest Reference Range & Units Most Recent  TRAB <=2.00 IU/L 4.91 (H) 12/12/18 09:38      Thyroid ultrasound 12/08/2020  FINDINGS: Parenchymal Echotexture: Mildly heterogeneous   Isthmus: 0.8 cm   Right lobe: 6.3 x 1.9 x 2.6 cm   Left lobe: 7.0 x 2.7 x 3.1 cm   _________________________________________________________   Estimated total number of nodules >/= 1 cm: 4   Number of spongiform nodules >/=  2 cm not described  below (TR1): 0   Number of mixed cystic and solid nodules >/= 1.5 cm not described below ([place]): 0   _________________________________________________________   Nodule # 1:   Location: Isthmus; superior   Maximum size: 1.4 cm; Other 2 dimensions: 1.0 x 0.6 cm   Composition: solid/almost completely solid (2)   Echogenicity: hypoechoic (2)   Shape: not  taller-than-wide (0)   Margins: smooth (0)   Echogenic foci: none (0)   ACR TI-RADS total points: 4.   ACR TI-RADS risk category: TR4 (4-6 points).   ACR TI-RADS recommendations:   *Given size (>/= 1 - 1.4 cm) and appearance, a follow-up ultrasound in 1 year should be considered based on TI-RADS criteria.   _________________________________________________________   Nodule 2: 1.0 x 0.9 x 0.3 cm predominantly cystic nodule in Sarah mid isthmus does not meet criteria for FNA or imaging surveillance.   _________________________________________________________   Nodule # 3:   Location: Isthmus; inferior   Maximum size: 0.8 cm; Other 2 dimensions: 0.5 x 0.6 cm   Composition: cystic/almost completely cystic (0)   Echogenicity: anechoic (0)   Shape: not taller-than-wide (0)   Margins: smooth (0)   Echogenic foci: peripheral calcifications (2)   ACR TI-RADS total points: 2.   ACR TI-RADS risk category: TR2 (2 points).   ACR TI-RADS recommendations:   This nodule does NOT meet TI-RADS criteria for biopsy or dedicated follow-up.   _________________________________________________________   Nodule 4: 0.9 x 0.6 x 0.8 cm isoechoic nodule in Sarah mid right thyroid lobe does not meet criteria for imaging surveillance or FNA.   _________________________________________________________   Nodule 5: 2.6 x 0.8 x 2.1 cm isoechoic region in Sarah superior left thyroid lobe is favored to be a pseudo nodule as it lacks well-defined margins.   _________________________________________________________   Nodule 6: 1.1 x 1.0 x  0.4 cm spongiform nodule in Sarah superior left thyroid lobe does not meet criteria for imaging surveillance or FNA. _________________________________________________________   Nodule 7: 0.8 x 0.4 x 0.6 cm solid isoechoic nodule in Sarah superior left thyroid lobe does not meet criteria for imaging surveillance or FNA.   _________________________________________________________   Multiple prominent bilateral neck lymph nodes are seen with Sarah largest located in Sarah left neck, measuring 3.4 x 0.6 x 1.2 cm.   IMPRESSION: 1. Nodule 1 (TI-RADS 4) located in Sarah superior isthmus meets criteria for imaging follow-up. Annual ultrasound surveillance is recommended until 5 years of stability is documented. 2. Multiple prominent bilateral neck lymph nodes are seen. These findings are nonspecific and are likely reactive. Given Sarah size of Sarah largest node on Sarah left, further evaluation with contrast enhanced soft tissue neck CT should be considered.     ASSESSMENT / PLAN / RECOMMENDATIONS:   1) Type 2 Diabetes Mellitus, Poorly controlled, With out complications - Most recent A1c of 9.9 %. Goal A1c <7.0%.  - A1c has trended up from 7.3% to 9.9%  -  SGLT-2 inhibtors have been cost prohibitive  - Discussed GLp-1 agonists, cautioned against GI side effects , #30 sample provided - If GLP-1 agonists are cost prohibitive , we discussed starting pioglitazone, cautioned against weight gain and LE edema  -Caution against GI side effects with Rybelsus, she was also advised to reduce glipizide to once daily should her BG's remain below 100 mg/DL  MEDICATIONS: Increase glipizide 10 mg to 1.5 tablets before Breakfast and before supper   Start Rybelsus 3 mg daily for 1 month, then increase to 7 mg daily    EDUCATION / INSTRUCTIONS: BG monitoring instructions: Ibarra is instructed to check her blood sugars 1 times a day, alternating between fasting and supper. Call [facility] if: BG  persistently < 70  I reviewed Sarah Rule of 15 for Sarah treatment of hypoglycemia in detail with Sarah Ibarra. Literature supplied.     2) Diabetic complications:  Eye:  Does night have known diabetic retinopathy.  Neuro/ Feet: Does not have known diabetic peripheral neuropathy .  Renal: Ibarra does not have known baseline CKD. She   is not on an ACEI/ARB at present.  Normal MA/CR ratio.    3) Graves' Disease:    - She was on Methimazole for approximately a year 11/2018 to 10/2019, this was resumed in October 2023 with a suppressed TSH <0.01 uIU/mL - No extrathyroidal manifestations of Graves' disease    Medication Restart methimazole 5 mg daily   4) Multinodular goiter:  -No local neck symptoms -We will need repeat thyroid ultrasound 11/2021   F/U in 6 months   Signed electronically by: Lyndle Herrlich, MD  [facility] Endocrinology  [facility] [address] [place] [place], [place] [address] Phone: [phone] FAX: [phone]   CC: Sandford Craze, NP [address] [place] [address] Phone: [phone]  Fax: [phone]  Return to Endocrinology clinic as below: Future Appointments  Date Time Provider Department Center  10/22/2021  9:10 AM Sebrena Engh, Konrad Dolores, MD [id number] None  11/22/2021  9:00 AM Sandford Craze, NP [facility]

## 2021-10-22 NOTE — Patient Instructions (Signed)
-   Increase Glipizide 10 mg, one and a half tablets  before Breakfast and before Supper  - Start Rybelsus 3 mg ,1 tablet before Breakfast for a month, than increase to 7 mg daily      HOW TO TREAT LOW BLOOD SUGARS (Blood sugar LESS THAN 70 MG/DL) Please follow the RULE OF 15 for the treatment of hypoglycemia treatment (when your (blood sugars are less than 70 mg/dL)   STEP 1: Take 15 grams of carbohydrates when your blood sugar is low, which includes:  3-4 GLUCOSE TABS  OR 3-4 OZ OF JUICE OR REGULAR SODA OR ONE TUBE OF GLUCOSE GEL    STEP 2: RECHECK blood sugar in 15 MINUTES STEP 3: If your blood sugar is still low at the 15 minute recheck --> then, go back to STEP 1 and treat AGAIN with another 15 grams of carbohydrates.

## 2021-10-23 LAB — T3: T3, Total: 175 ng/dL (ref 76–181)

## 2021-10-25 MED ORDER — METHIMAZOLE 5 MG PO TABS: 5.0000 mg | ORAL_TABLET | Freq: Every day | ORAL | 2 refills | Status: DC

## 2021-11-12 LAB — HM DIABETES EYE EXAM

## 2021-11-15 ENCOUNTER — Other Ambulatory Visit: Payer: Self-pay

## 2021-11-15 MED ORDER — RYBELSUS 7 MG PO TABS: 7.0000 mg | ORAL_TABLET | Freq: Every day | ORAL | 2 refills | Status: DC

## 2021-11-22 ENCOUNTER — Ambulatory Visit (INDEPENDENT_AMBULATORY_CARE_PROVIDER_SITE_OTHER): Payer: 59 | Admitting: Family

## 2021-11-22 VITALS — BP 122/68 | HR 74 | Temp 98.0°F | Resp 16 | Wt 191.0 lb

## 2021-11-22 DIAGNOSIS — E7800 Pure hypercholesterolemia, unspecified: Secondary | ICD-10-CM | POA: Diagnosis not present

## 2021-11-22 DIAGNOSIS — E663 Overweight: Secondary | ICD-10-CM

## 2021-11-22 DIAGNOSIS — I480 Paroxysmal atrial fibrillation: Secondary | ICD-10-CM

## 2021-11-22 DIAGNOSIS — E042 Nontoxic multinodular goiter: Secondary | ICD-10-CM

## 2021-11-22 DIAGNOSIS — E1165 Type 2 diabetes mellitus with hyperglycemia: Secondary | ICD-10-CM

## 2021-11-22 DIAGNOSIS — E0590 Thyrotoxicosis, unspecified without thyrotoxic crisis or storm: Secondary | ICD-10-CM

## 2021-11-22 LAB — MICROALBUMIN / CREATININE URINE RATIO
Creatinine,U: 209 mg/dL
Microalb Creat Ratio: 0.5 mg/g (ref 0.0–30.0)
Microalb, Ur: 0.9 mg/dL (ref 0.0–1.9)

## 2021-11-22 NOTE — Assessment & Plan Note (Addendum)
Lab Results  Component Value Date   CHOL 155 05/21/2021   HDL 47.40 05/21/2021   LDLCALC 82 05/21/2021   TRIG 129.0 05/21/2021   CHOLHDL 3 05/21/2021   At goal on lipitor. Continue same.   BP Readings from Last 3 Encounters:  11/22/21 122/68  10/22/21 124/80  09/15/21 118/70

## 2021-11-22 NOTE — Assessment & Plan Note (Signed)
Patient is being treated with tapazole- management per endo.

## 2021-11-22 NOTE — Assessment & Plan Note (Signed)
Endo placed order for follow up thyroid US- advised pt to stop by imaging to schedule.

## 2021-11-22 NOTE — Assessment & Plan Note (Signed)
Wt Readings from Last 3 Encounters:  11/22/21 191 lb (86.6 kg)  10/22/21 192 lb 3.2 oz (87.2 kg)  09/15/21 190 lb 3.2 oz (86.3 kg)   She feels less hungry on the rybelsis and continues to work on weigh tloss.

## 2021-11-22 NOTE — Progress Notes (Signed)
Subjective:   By signing my name below, I, Shehryar Baig, attest that this documentation has been prepared under the direction and in the presence of Sandford Craze, NP. 11/22/2021    Patient ID: Sarah Ibarra, female    DOB: 1965/10/14, 56 y.o.   MRN: [id number]  Chief Complaint  Patient presents with   Diabetes    Here for follow up    Diabetes   Patient is in today for a follow up visit.   Blood sugar- She continues following up with her endocrinologist regularly. She was prescribed 7 mg rybelsus during her last visit with them and is taking it regularly at this time. She reports decreased appetite while taking rybelsus.  Lab Results  Component Value Date   HGBA1C 9.9 (A) 10/22/2021   Cholesterol- Her last cholesterol levels looked good while taking 10 mg Lipitor daily PO.  Lab Results  Component Value Date   CHOL 155 05/21/2021   HDL 47.40 05/21/2021   LDLCALC 82 05/21/2021   TRIG 129.0 05/21/2021   CHOLHDL 3 05/21/2021   Blood pressure- Her blood pressure is doing well during this visit. She continues taking 25 mg metoprolol tartrate.  BP Readings from Last 3 Encounters:  11/22/21 122/68  10/22/21 124/80  09/15/21 118/70   Pulse Readings from Last 3 Encounters:  11/22/21 74  10/22/21 71  09/15/21 82   Immunizations- She was recommended to receive the new Covid-19 booster vaccine at her pharmacy.    Health Maintenance Due  Topic Date Due   COVID-19 Vaccine (3 - Pfizer series) 07/04/2019   Diabetic kidney evaluation - Urine ACR  05/26/2021   FOOT EXAM  11/20/2021    Past Medical History:  Diagnosis Date   Arthritis    low back pain   Chicken pox as a child   Depression with anxiety    pt denies   Diabetes mellitus without complication (HCC)    Frequent headaches    Hx: UTI (urinary tract infection)    Hyperlipidemia    Hyperthyroidism 10/08/2018   Migraines    Mumps as a child   Overweight    UTI (urinary tract infection)     Past  Surgical History:  Procedure Laterality Date   CARDIOVERSION  12/13/2018   CESAREAN SECTION  1993   TUBAL LIGATION  1993    Family History  Problem Relation Age of Onset   Other Mother        low blood pressure   Hyperlipidemia Mother    Multiple sclerosis Son    Stroke Maternal Grandmother    Diabetes Maternal Grandmother        type 2   Alzheimer's disease Paternal Grandfather    Colon cancer Neg Hx    Colon polyps Neg Hx    Esophageal cancer Neg Hx    Stomach cancer Neg Hx    Rectal cancer Neg Hx     Social History   Socioeconomic History   Marital status: Single    Spouse name: Not on file   Number of children: 3   Years of education: Not on file   Highest education level: Not on file  Occupational History   Occupation: Catering manager at [employer]  Tobacco Use   Smoking status: Never   Smokeless tobacco: Never  Vaping Use   Vaping Use: Never used  Substance and Sexual Activity   Alcohol use: No   Drug use: No   Sexual activity: Not Currently    Partners: Male  Comment: lives with mother  Other Topics Concern   Not on file  Social History Narrative   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"   Single, never married   3 children (2 daughters local) Son in [place], 5 grandchildren   Lives with her mother who has a dog   Enjoys Estate agent   Social Determinants of Health   Financial Resource Strain: Low Risk  (05/30/2017)   Overall Financial Resource Strain (CARDIA)    Difficulty of Paying Living Expenses: Not hard at all  Food Insecurity: No Food Insecurity (05/30/2017)   Hunger Vital Sign    Worried About Running Out of Food in the Last Year: Never true    [place] in the Last Year: Never true  Transportation Needs: No Transportation Needs (05/30/2017)   PRAPARE - Hydrologist (Medical): No    Lack of Transportation (Non-Medical): No  Physical Activity: Sufficiently Active (05/30/2017)   Exercise  Vital Sign    Days of Exercise per Week: 3 days    Minutes of Exercise per Session: 60 min  Stress: No Stress Concern Present (05/30/2017)   [place]    Feeling of Stress : Not at all  Social Connections: Unknown (05/30/2017)   Social Connection and Isolation Panel [NHANES]    Frequency of Communication with Friends and Family: More than three times a week    Frequency of Social Gatherings with Friends and Family: More than three times a week    Attends Religious Services: More than 4 times per year    Active Member of [employer] or Organizations: No    Attends Archivist Meetings: Never    Marital Status: Not on file  Intimate Partner Violence: Not At Risk (05/30/2017)   Humiliation, Afraid, Rape, and Kick questionnaire    Fear of Current or Ex-Partner: No    Emotionally Abused: No    Physically Abused: No    Sexually Abused: No    Outpatient Medications Prior to Visit  Medication Sig Dispense Refill   atorvastatin (LIPITOR) 10 MG tablet Take 1 tablet (10 mg total) by mouth daily. 90 tablet 3   glipiZIDE (GLUCOTROL) 10 MG tablet Take 1.5 tablets (15 mg total) by mouth 2 (two) times daily before a meal. 270 tablet 3   methimazole (TAPAZOLE) 5 MG tablet Take 1 tablet (5 mg total) by mouth daily. 90 tablet 2   metoprolol tartrate (LOPRESSOR) 25 MG tablet Take 1 tablet (25 mg total) by mouth 2 (two) times daily. 180 tablet 3   Semaglutide (RYBELSUS) 7 MG TABS Take 7 mg by mouth daily. 90 tablet 2   No facility-administered medications prior to visit.    Allergies  Allergen Reactions   Morphine And Related Hives    ROS See HPI    Objective:    Physical Exam Constitutional:      General: She is not in acute distress.    Appearance: Normal appearance. She is not ill-appearing.  HENT:     Head: Normocephalic and atraumatic.     Right Ear: External ear normal.     Left Ear: External ear normal.  Eyes:      Extraocular Movements: Extraocular movements intact.     Pupils: Pupils are equal, round, and reactive to light.  Cardiovascular:     Rate and Rhythm: Normal rate and regular rhythm.     Heart sounds: Normal heart sounds.  No murmur heard.    No gallop.  Pulmonary:     Effort: Pulmonary effort is normal. No respiratory distress.     Breath sounds: Normal breath sounds. No wheezing or rales.  Skin:    General: Skin is warm and dry.  Neurological:     Mental Status: She is alert and oriented to person, place, and time.  Psychiatric:        Judgment: Judgment normal.     BP 122/68 (BP Location: Right Arm, Patient Position: Sitting, Cuff Size: Small)   Pulse 74   Temp 98 F (36.7 C) (Oral)   Resp 16   Wt 191 lb (86.6 kg)   LMP 04/06/2012   SpO2 99%   BMI 28.21 kg/m  Wt Readings from Last 3 Encounters:  11/22/21 191 lb (86.6 kg)  10/22/21 192 lb 3.2 oz (87.2 kg)  09/15/21 190 lb 3.2 oz (86.3 kg)       Assessment & Plan:   Problem List Items Addressed This Visit       Unprioritized   Type 2 diabetes mellitus with hyperglycemia, without long-term current use of insulin (HCC)   Relevant Orders   Urine Microalbumin w/creat. ratio   PAF (paroxysmal atrial fibrillation) (HCC)    Occurred in the setting of hyperthyroid, therefore cardiology is not anticoagulating. Management per cardiology.       Overweight    Wt Readings from Last 3 Encounters:  11/22/21 191 lb (86.6 kg)  10/22/21 192 lb 3.2 oz (87.2 kg)  09/15/21 190 lb 3.2 oz (86.3 kg)  She feels less hungry on the rybelsis and continues to work on weigh tloss.       Multinodular goiter    Endo placed order for follow up thyroid US- advised pt to stop by imaging to schedule.      Hyperthyroidism    Patient is being treated with tapazole- management per endo.       Hyperlipidemia - Primary    Lab Results  Component Value Date   CHOL 155 05/21/2021   HDL 47.40 05/21/2021   LDLCALC 82 05/21/2021   TRIG 129.0  05/21/2021   CHOLHDL 3 05/21/2021  At goal on lipitor. Continue same.   BP Readings from Last 3 Encounters:  11/22/21 122/68  10/22/21 124/80  09/15/21 118/70          No orders of the defined types were placed in this encounter.   I, Lemont Fillers, NP, personally preformed the services described in this documentation.  All medical record entries made by the scribe were at my direction and in my presence.  I have reviewed the chart and discharge instructions (if applicable) and agree that the record reflects my personal performance and is accurate and complete. 11/22/2021   I,Shehryar Baig,acting as a scribe for Lemont Fillers, NP.,have documented all relevant documentation on the behalf of Lemont Fillers, NP,as directed by  Lemont Fillers, NP while in the presence of Lemont Fillers, NP.   Lemont Fillers, NP

## 2021-11-22 NOTE — Assessment & Plan Note (Signed)
Occurred in the setting of hyperthyroid, therefore cardiology is not anticoagulating. Management per cardiology.

## 2021-11-29 ENCOUNTER — Ambulatory Visit (HOSPITAL_BASED_OUTPATIENT_CLINIC_OR_DEPARTMENT_OTHER): Admission: RE | Admit: 2021-11-29 | Payer: 59 | Source: Ambulatory Visit

## 2021-12-02 ENCOUNTER — Ambulatory Visit (HOSPITAL_BASED_OUTPATIENT_CLINIC_OR_DEPARTMENT_OTHER)
Admission: RE | Admit: 2021-12-02 | Discharge: 2021-12-02 | Disposition: A | Discharge status: Home / Self Care | Payer: 59 | Source: Ambulatory Visit | Attending: Internal Medicine | Admitting: Internal Medicine

## 2021-12-02 DIAGNOSIS — E042 Nontoxic multinodular goiter: Secondary | ICD-10-CM | POA: Diagnosis present

## 2022-02-09 ENCOUNTER — Other Ambulatory Visit: Payer: Self-pay | Admitting: Internal Medicine

## 2022-04-25 ENCOUNTER — Encounter: Payer: Self-pay | Admitting: Internal Medicine

## 2022-04-25 ENCOUNTER — Ambulatory Visit (INDEPENDENT_AMBULATORY_CARE_PROVIDER_SITE_OTHER): Payer: 59 | Admitting: Internal Medicine

## 2022-04-25 VITALS — BP 122/80 | HR 77 | Ht 69.0 in | Wt 192.0 lb

## 2022-04-25 DIAGNOSIS — E042 Nontoxic multinodular goiter: Secondary | ICD-10-CM

## 2022-04-25 DIAGNOSIS — E119 Type 2 diabetes mellitus without complications: Secondary | ICD-10-CM

## 2022-04-25 DIAGNOSIS — E0590 Thyrotoxicosis, unspecified without thyrotoxic crisis or storm: Secondary | ICD-10-CM

## 2022-04-25 LAB — POCT GLYCOSYLATED HEMOGLOBIN (HGB A1C): Hemoglobin A1C: 6.8 % — AB (ref 4.0–5.6)

## 2022-04-25 LAB — T4, FREE: Free T4: 0.7 ng/dL (ref 0.60–1.60)

## 2022-04-25 LAB — TSH: TSH: 1.98 u[IU]/mL (ref 0.35–5.50)

## 2022-04-25 MED ORDER — GLIPIZIDE 10 MG PO TABS: ORAL_TABLET | ORAL | 3 refills | Status: DC

## 2022-04-25 MED ORDER — RYBELSUS 7 MG PO TABS: 7.0000 mg | ORAL_TABLET | Freq: Every day | ORAL | 3 refills | Status: DC

## 2022-04-25 NOTE — Progress Notes (Unsigned)
Name: Sarah Ibarra  Age/ Sex: 57 y.o., female   MRN/ DOB: [id number], 18-Feb-1965     PCP: Sandford Craze, NP   Reason for Endocrinology Evaluation: Type 2 Diabetes Mellitus  Initial Endocrine Consultative Visit: 12/12/2018    PATIENT IDENTIFIER: Sarah Ibarra is a 57 y.o. female with a past medical history of T2DM and Dyslipidemia. The patient has followed with Endocrinology clinic since 12/12/2018 for consultative assistance with management of her diabetes.  DIABETIC HISTORY:  Ms. Tennis was diagnosed with DM in 2009, she has been on Metformin since diagnosis.Her hemoglobin A1c has ranged from 8.3% in 2019, peaking at 12.5% in 2020 On her initial visit to our clinic she had an A1c of 12.5%, she declined insulin at the time and opted for Glipizide. We stopped Metformin due to nausea   Farxiga started 01/2020 but stopped Farxiga 2023 due to cost of $1400, attempted to prescribe Jardiance April 2023 which was also cost prohibitive   Rybelsus was started 10/2021  THYROID HISTORY:  Pt has been noted to have suppressed TSH < 0.01 uIU/mL with elevated FT4 3.11 ng/dL during routine workup. She was noted to have weight loss at the time of her presentation but no other symptoms. She was started on Methimazole 11/2018 TRAB level slightly elevated at 4.91 IU/L    Mother with thyroid disease  SUBJECTIVE:   During the last visit (10/22/2021): A1c 9.9 %     Today (04/25/2022): [name] is here for a follow up on diabetes.  She checks her blood sugars 1 times daily. The patient has had hypoglycemic episodes since the last clinic visit.   Denies nausea or  Diarrhea Denies palpitations Denies local neck swelling  Denies tremors   HOME ENDOCRINE REGIMEN:  Glipizide 10 mg, 1.5 tabs before breakfast and 1.5 tab before supper Rybelsus 7 mg daily Methimazole 5 mg daily     Statin: Yes ACE-I/ARB: No    METER DOWNLOAD SUMMARY: [place]  90 day average 105  mg/dL  Low 68 High  759   DIABETIC COMPLICATIONS: Microvascular complications:   Denies: CKD , neuropathy, retinopathy  Last Eye Exam: Completed  10/2021  Macrovascular complications:   Denies: CAD, CVA, PVD   HISTORY:  Past Medical History:  Past Medical History:  Diagnosis Date   Arthritis    low back pain   Chicken pox as a child   Depression with anxiety    pt denies   Diabetes mellitus without complication    Frequent headaches    Hx: UTI (urinary tract infection)    Hyperlipidemia    Hyperthyroidism 10/08/2018   Migraines    Mumps as a child   Overweight    UTI (urinary tract infection)    Past Surgical History:  Past Surgical History:  Procedure Laterality Date   CARDIOVERSION  12/13/2018   CESAREAN SECTION  1993   TUBAL LIGATION  1993   Social History:  reports that she has never smoked. She has never used smokeless tobacco. She reports that she does not drink alcohol and does not use drugs. Family History:  Family History  Problem Relation Age of Onset   Other Mother        low blood pressure   Hyperlipidemia Mother    Multiple sclerosis Son    Stroke Maternal Grandmother    Diabetes Maternal Grandmother        type 2   Alzheimer's disease Paternal Grandfather    Colon cancer Neg Hx    Colon polyps  Neg Hx    Esophageal cancer Neg Hx    Stomach cancer Neg Hx    Rectal cancer Neg Hx      HOME MEDICATIONS: Allergies as of 04/25/2022       Reactions   Morphine And Related Hives        Medication List        Accurate as of April 25, 2022  8:54 AM. If you have any questions, ask your nurse or doctor.          atorvastatin 10 MG tablet Commonly known as: LIPITOR Take 1 tablet (10 mg total) by mouth daily.   glipiZIDE 10 MG tablet Commonly known as: GLUCOTROL TAKE 1 1/2 TABLETS BY MOUTH DAILY BEFORE BREAKFAST AND 1 TABLET BEFORE SUPPER   methimazole 5 MG tablet Commonly known as: TAPAZOLE Take 1 tablet (5 mg total) by mouth daily.   metoprolol  tartrate 25 MG tablet Commonly known as: LOPRESSOR Take 1 tablet (25 mg total) by mouth 2 (two) times daily.   Rybelsus 7 MG Tabs Generic drug: Semaglutide Take 7 mg by mouth daily.         OBJECTIVE:   Vital Signs: BP 122/80 (BP Location: Left Arm, Patient Position: Sitting, Cuff Size: Large)   Pulse 77   Ht 5\' 9"  (1.753 m)   Wt 192 lb (87.1 kg)   LMP 04/06/2012   SpO2 98%   BMI 28.35 kg/m   Wt Readings from Last 3 Encounters:  04/25/22 192 lb (87.1 kg)  11/22/21 191 lb (86.6 kg)  10/22/21 192 lb 3.2 oz (87.2 kg)     Exam: General: Pt appears well and is in NAD  Neck: General: Supple without adenopathy. Thyroid: Prominent thyroid   Lungs: Clear with good BS   Heart: RRR   Extremities: No pretibial edema.   Neuro: MS is good with appropriate affect, pt is alert and Ox3    DM foot exam: 04/25/2022  The skin of the feet is without sores or ulcerations. The pedal pulses are 2+ on right and 2+ on left. The sensation is intact to a screening 5.07, 10 gram monofilament bilaterally    DATA REVIEWED:  Lab Results  Component Value Date   HGBA1C 6.8 (A) 04/25/2022   HGBA1C 9.9 (A) 10/22/2021   HGBA1C 7.3 (A) 04/22/2021       Latest Reference Range & Units 09/07/21 14:43  Sodium 135 - 145 mmol/L 136  Potassium 3.5 - 5.1 mmol/L 4.0  Chloride 98 - 111 mmol/L 103  CO2 22 - 32 mmol/L 25  Glucose 70 - 99 mg/dL 458 (H)  BUN 6 - 20 mg/dL 17  Creatinine 5.92 - 9.24 mg/dL 4.62  Calcium 8.9 - 86.3 mg/dL 9.6  Anion gap 5 - 15  8  GFR, Estimated >60 mL/min >60    Latest Reference Range & Units 11/22/21 09:16  Creatinine,U mg/dL 817.7  Microalb, Ur 0.0 - 1.9 mg/dL 0.9  MICROALB/CREAT RATIO 0.0 - 30.0 mg/g 0.5        Latest Reference Range & Units Most Recent  TRAB <=2.00 IU/L 4.91 (H) 12/12/18 09:38      Thyroid ultrasound 12/02/2021  Estimated total number of nodules >/= 1 cm: 1   Number of spongiform nodules >/=  2 cm not described below (TR1): 0    Number of mixed cystic and solid nodules >/= 1.5 cm not described below (TR2): 0   _________________________________________________________   Nodule # 1: Decreasing size of solid nodule in the  superior aspect of the thyroid isthmus. The nodule no longer meets criteria for imaging surveillance.   Additional tiny nodules seen bilaterally. None meet criteria to warrant imaging surveillance.   IMPRESSION: 1. Interval involution of the TI-RADS category 4 nodule in the superior isthmus. Involution over time is consistent with benignity. No further follow-up recommended. 2. Similar appearance of enlarged, heterogeneous multinodular thyroid gland. All remaining thyroid nodules are tiny and do not meet criteria for surveillance.  ASSESSMENT / PLAN / RECOMMENDATIONS:   1) Type 2 Diabetes Mellitus, Optimally controlled, With out complications - Most recent A1c of 6.8 %. Goal A1c <7.0%.  - A1c has trended down  - Pt with hypoglycemia, will reduce evening glipizide as below  -  SGLT-2 inhibtors have been cost prohibitive   MEDICATIONS: Decrease glipizide 10 mg to 1.5 tablets before Breakfast and  before supper   tablet  Continue Rybelsus 7  mg daily     EDUCATION / INSTRUCTIONS: BG monitoring instructions: Patient is instructed to check her blood sugars 1 times a day, alternating between fasting and supper. Call [facility] if: BG persistently < 70  I reviewed the Rule of 15 for the treatment of hypoglycemia in detail with the patient. Literature supplied.     2) Diabetic complications:  Eye: Does night have known diabetic retinopathy.  Neuro/ Feet: Does not have known diabetic peripheral neuropathy .  Renal: Patient does not have known baseline CKD. She   is not on an ACEI/ARB at present.  Normal MA/CR ratio.    3) Graves' Disease:    - She was on Methimazole for approximately a year 11/2018 to 10/2019, this was resumed in October 2023 with a suppressed TSH  <0.01 uIU/mL - No extrathyroidal manifestations of Graves' disease    Medication Restart methimazole 5 mg daily   4) Multinodular goiter:  -No local neck symptoms -Her last thyroid ultrasound 11/2021 showed evaluation of category 4 nodule, and the rest of the nodules did not meet criteria for a follow-up     F/U in 6 months   Signed electronically by: Lyndle HerrlichAbby Jaralla Deandre Stansel, MD  [facility]  [facility] [address] [place], [place] [address] Phone: [phone] FAX: [phone]   CC: Sandford CrazeO'Sullivan, Melissa, NP [address] [place] [address] Phone: [phone]  Fax: [phone]  Return to Endocrinology clinic as below: Future Appointments  Date Time Provider Department Center  05/23/2022 11:40 AM Sandford Craze'Sullivan, Melissa, NP [id number]

## 2022-04-25 NOTE — Patient Instructions (Addendum)
-   Change Glipizide 10 mg, one and a half tablets  before Breakfast and before ONE tablet before Supper  - Continue Rybelsus 7 mg ,1 tablet before Breakfast     HOW TO TREAT LOW BLOOD SUGARS (Blood sugar LESS THAN 70 MG/DL) Please follow the RULE OF 15 for the treatment of hypoglycemia treatment (when your (blood sugars are less than 70 mg/dL)   STEP 1: Take 15 grams of carbohydrates when your blood sugar is low, which includes:  3-4 GLUCOSE TABS  OR 3-4 OZ OF JUICE OR REGULAR SODA OR ONE TUBE OF GLUCOSE GEL    STEP 2: RECHECK blood sugar in 15 MINUTES STEP 3: If your blood sugar is still low at the 15 minute recheck --> then, go back to STEP 1 and treat AGAIN with another 15 grams of carbohydrates.

## 2022-04-26 LAB — T3: T3, Total: 125 ng/dL (ref 76–181)

## 2022-04-26 MED ORDER — METHIMAZOLE 5 MG PO TABS: 5.0000 mg | ORAL_TABLET | Freq: Every day | ORAL | 2 refills | Status: DC

## 2022-05-23 ENCOUNTER — Encounter: Payer: 59 | Admitting: Family

## 2022-05-31 ENCOUNTER — Ambulatory Visit (INDEPENDENT_AMBULATORY_CARE_PROVIDER_SITE_OTHER): Payer: 59 | Admitting: Family

## 2022-05-31 ENCOUNTER — Encounter: Payer: Self-pay | Admitting: Family

## 2022-05-31 VITALS — BP 120/67 | HR 71 | Temp 98.6°F | Resp 16 | Ht 69.0 in | Wt 197.0 lb

## 2022-05-31 DIAGNOSIS — Z862 Personal history of diseases of the blood and blood-forming organs and certain disorders involving the immune mechanism: Secondary | ICD-10-CM

## 2022-05-31 DIAGNOSIS — E1165 Type 2 diabetes mellitus with hyperglycemia: Secondary | ICD-10-CM | POA: Diagnosis not present

## 2022-05-31 DIAGNOSIS — E785 Hyperlipidemia, unspecified: Secondary | ICD-10-CM

## 2022-05-31 DIAGNOSIS — Z0000 Encounter for general adult medical examination without abnormal findings: Secondary | ICD-10-CM

## 2022-05-31 DIAGNOSIS — E7800 Pure hypercholesterolemia, unspecified: Secondary | ICD-10-CM | POA: Diagnosis not present

## 2022-05-31 DIAGNOSIS — F418 Other specified anxiety disorders: Secondary | ICD-10-CM

## 2022-05-31 DIAGNOSIS — E0590 Thyrotoxicosis, unspecified without thyrotoxic crisis or storm: Secondary | ICD-10-CM | POA: Diagnosis not present

## 2022-05-31 DIAGNOSIS — G43909 Migraine, unspecified, not intractable, without status migrainosus: Secondary | ICD-10-CM

## 2022-05-31 DIAGNOSIS — Z1231 Encounter for screening mammogram for malignant neoplasm of breast: Secondary | ICD-10-CM

## 2022-05-31 LAB — COMPREHENSIVE METABOLIC PANEL
ALT: 14 U/L (ref 0–35)
AST: 16 U/L (ref 0–37)
Albumin: 3.8 g/dL (ref 3.5–5.2)
Alkaline Phosphatase: 73 U/L (ref 39–117)
BUN: 13 mg/dL (ref 6–23)
CO2: 30 mEq/L (ref 19–32)
Calcium: 9.5 mg/dL (ref 8.4–10.5)
Chloride: 103 mEq/L (ref 96–112)
Creatinine, Ser: 0.88 mg/dL (ref 0.40–1.20)
GFR: 72.06 mL/min (ref >60.00)
Glucose, Bld: 118 mg/dL — ABNORMAL HIGH (ref 70–99)
Potassium: 4.8 mEq/L (ref 3.5–5.1)
Sodium: 141 mEq/L (ref 135–145)
Total Bilirubin: 0.4 mg/dL (ref 0.2–1.2)
Total Protein: 7.5 g/dL (ref 6.0–8.3)

## 2022-05-31 LAB — CBC WITH DIFFERENTIAL/PLATELET
Basophils Absolute: 0 10*3/uL (ref 0.0–0.1)
Basophils Relative: 0.6 % (ref 0.0–3.0)
Eosinophils Absolute: 0.1 10*3/uL (ref 0.0–0.7)
Eosinophils Relative: 2.4 % (ref 0.0–5.0)
HCT: 35.8 % — ABNORMAL LOW (ref 36.0–46.0)
Hemoglobin: 11.6 g/dL — ABNORMAL LOW (ref 12.0–15.0)
Lymphocytes Relative: 40.3 % (ref 12.0–46.0)
Lymphs Abs: 2.4 10*3/uL (ref 0.7–4.0)
MCHC: 32.5 g/dL (ref 30.0–36.0)
MCV: 85.4 fl (ref 78.0–100.0)
Monocytes Absolute: 0.7 10*3/uL (ref 0.1–1.0)
Monocytes Relative: 11.5 % (ref 3.0–12.0)
Neutro Abs: 2.7 10*3/uL (ref 1.4–7.7)
Neutrophils Relative %: 45.2 % (ref 43.0–77.0)
Platelets: 318 10*3/uL (ref 150.0–400.0)
RBC: 4.19 Mil/uL (ref 3.87–5.11)
RDW: 13.7 % (ref 11.5–15.5)
WBC: 6 10*3/uL (ref 4.0–10.5)

## 2022-05-31 LAB — LIPID PANEL
Cholesterol: 171 mg/dL (ref 0–200)
HDL: 50.4 mg/dL (ref >39.00)
LDL Cholesterol: 107 mg/dL — ABNORMAL HIGH (ref 0–99)
NonHDL: 120.81
Total CHOL/HDL Ratio: 3
Triglycerides: 70 mg/dL (ref 0.0–149.0)
VLDL: 14 mg/dL (ref 0.0–40.0)

## 2022-05-31 NOTE — Assessment & Plan Note (Signed)
Lab Results  Component Value Date   CHOL 155 05/21/2021   HDL 47.40 05/21/2021   LDLCALC 82 05/21/2021   TRIG 129.0 05/21/2021   CHOLHDL 3 05/21/2021   LDL at Goal last year. Will update. Continue atorvastatin.

## 2022-05-31 NOTE — Assessment & Plan Note (Signed)
Maintained on tapazole per Endo. Stable.  Lab Results  Component Value Date   TSH 1.98 04/25/2022

## 2022-05-31 NOTE — Progress Notes (Signed)
Subjective:   By signing my name below, I, Sarah Ibarra, attest that this documentation has been prepared under the direction and in the presence of Lemont Fillers, NP 05/31/22   Patient ID: Sarah Ibarra, female    DOB: 1965/11/10, 57 y.o.   MRN: [id number]  Chief Complaint  Patient presents with   Annual Exam    HPI Patient is in today for a comprehensive physical exam.   Diabetes:  She has been following up with her endocrinologist, Dr. Lonzo Cloud. Her diabetes is overall well managed. Her last A1C was normal.   Constipation: She has been having bowel movements every 3 days. She states her stool tends to be hard.   Calf soreness: She complains of bilateral calf soreness. She states the pain occurs when getting out of bed. She is unsure of what the pain could be attributed to.   Headache: She complains of a recent migraine for which she had to miss work. She took an Advil and rested to relieve symptoms.   Acute:  She denies having any fever, new joint pain, new moles, congestion, sinus pain, sore throat, chest pain, palpitations, cough, SOB, wheezing, n/v/d, blood in stool, dysuria, frequency, or hematuria at this time.   Immunizations: UTD on shingles, pneumonia, and flu vaccines.   Diet: She has been eating overall healthy. She reports having lost weight and weighing around 170 lbs before recently regaining some weight.   Last colonoscopy: 04/05/2019. Results were normal. Repeat in 10 years.   Last mammogram: 02/15/2021. Results were normal.   Last pap: 05/21/2021. Results were normal.   Last bone density: 11/05/2018. Results were normal.   Vision/Dental: She is UTD on routine vision and dental care.  Past Medical History:  Diagnosis Date   Arthritis    low back pain   Chicken pox as a child   Depression with anxiety    pt denies   Diabetes mellitus without complication (HCC)    Frequent headaches    Hx: UTI (urinary tract infection)    Hyperlipidemia     Hyperthyroidism 10/08/2018   Migraines    Mumps as a child   Overweight    UTI (urinary tract infection)     Past Surgical History:  Procedure Laterality Date   CARDIOVERSION  12/13/2018   CESAREAN SECTION  1993   TUBAL LIGATION  1993    Family History  Problem Relation Age of Onset   Other Mother        low blood pressure   Hyperlipidemia Mother    Multiple sclerosis Son    Stroke Maternal Grandmother    Diabetes Maternal Grandmother        type 2   Alzheimer's disease Paternal Grandfather    Colon cancer Neg Hx    Colon polyps Neg Hx    Esophageal cancer Neg Hx    Stomach cancer Neg Hx    Rectal cancer Neg Hx     Social History   Socioeconomic History   Marital status: Single    Spouse name: Not on file   Number of children: 3   Years of education: Not on file   Highest education level: Not on file  Occupational History   Occupation: Catering manager at [employer]  Tobacco Use   Smoking status: Never   Smokeless tobacco: Never  Vaping Use   Vaping Use: Never used  Substance and Sexual Activity   Alcohol use: No   Drug use: No   Sexual activity: Not  Currently    Partners: Male    Comment: lives with mother  Other Topics Concern   Not on file  Social History Narrative   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"   Single, never married   3 children (2 daughters local) Son in [place], 5 grandchildren   Lives with her mother who has a dog   Enjoys Diplomatic Services operational officer   Social Determinants of Health   Financial Resource Strain: Low Risk  (05/30/2017)   Overall Financial Resource Strain (CARDIA)    Difficulty of Paying Living Expenses: Not hard at all  Food Insecurity: No Food Insecurity (05/30/2017)   Hunger Vital Sign    Worried About Running Out of Food in the Last Year: Never true    Ran Out of Food in the Last Year: Never true  Transportation Needs: No Transportation Needs (05/30/2017)   PRAPARE - Administrator, Civil Service  (Medical): No    Lack of Transportation (Non-Medical): No  Physical Activity: Sufficiently Active (05/30/2017)   Exercise Vital Sign    Days of Exercise per Week: 3 days    Minutes of Exercise per Session: 60 min  Stress: No Stress Concern Present (05/30/2017)   [employer] of Occupational Health - Occupational Stress Questionnaire    Feeling of Stress : Not at all  Social Connections: Unknown (05/30/2017)   Social Connection and Isolation Panel [NHANES]    Frequency of Communication with Friends and Family: More than three times a week    Frequency of Social Gatherings with Friends and Family: More than three times a week    Attends Religious Services: More than 4 times per year    Active Member of [employer] or Organizations: No    Attends Banker Meetings: Never    Marital Status: Not on file  Intimate Partner Violence: Not At Risk (05/30/2017)   Humiliation, Afraid, Rape, and Kick questionnaire    Fear of Current or Ex-Partner: No    Emotionally Abused: No    Physically Abused: No    Sexually Abused: No    Outpatient Medications Prior to Visit  Medication Sig Dispense Refill   atorvastatin (LIPITOR) 10 MG tablet Take 1 tablet (10 mg total) by mouth daily. 90 tablet 3   glipiZIDE (GLUCOTROL) 10 MG tablet Take 1.5 tablets (15 mg total) by mouth daily before breakfast AND 1 tablet (10 mg total) daily before supper. 225 tablet 3   methimazole (TAPAZOLE) 5 MG tablet Take 1 tablet (5 mg total) by mouth daily. 90 tablet 2   metoprolol tartrate (LOPRESSOR) 25 MG tablet Take 1 tablet (25 mg total) by mouth 2 (two) times daily. 180 tablet 3   Semaglutide (RYBELSUS) 7 MG TABS Take 1 tablet (7 mg total) by mouth daily. 90 tablet 3   No facility-administered medications prior to visit.    Allergies  Allergen Reactions   Morphine And Related Hives    Review of Systems  Gastrointestinal:  Positive for constipation.  Musculoskeletal:  Positive for myalgias (bilateral calf  pain).  Neurological:  Positive for headaches.       Objective:    Physical Exam Constitutional:      General: She is not in acute distress.    Appearance: Normal appearance. She is well-developed.  HENT:     Head: Normocephalic and atraumatic.     Right Ear: Tympanic membrane, ear canal and external ear normal.     Left Ear: Tympanic membrane, ear canal and  external ear normal.     Mouth/Throat:     Mouth: Mucous membranes are moist.     Pharynx: Oropharynx is clear. Uvula midline.  Eyes:     General: No scleral icterus. Neck:     Thyroid: Thyromegaly (Mild) present.  Cardiovascular:     Rate and Rhythm: Normal rate and regular rhythm.     Heart sounds: Normal heart sounds. No murmur heard. Pulmonary:     Effort: Pulmonary effort is normal. No respiratory distress.     Breath sounds: Normal breath sounds. No wheezing.  Abdominal:     General: There is no distension.     Palpations: Abdomen is soft. There is no mass.     Tenderness: There is no abdominal tenderness.  Musculoskeletal:     Cervical back: Neck supple.  Skin:    General: Skin is warm and dry.  Neurological:     Mental Status: She is alert and oriented to person, place, and time.  Psychiatric:        Mood and Affect: Mood normal.        Behavior: Behavior normal.        Thought Content: Thought content normal.        Judgment: Judgment normal.     BP 120/67 (BP Location: Right Arm, Patient Position: Sitting, Cuff Size: Large)   Pulse 71   Temp 98.6 F (37 C) (Oral)   Resp 16   Ht 5\' 9"  (1.753 m)   Wt 197 lb (89.4 kg)   LMP 04/06/2012   SpO2 100%   BMI 29.09 kg/m  Wt Readings from Last 3 Encounters:  05/31/22 197 lb (89.4 kg)  04/25/22 192 lb (87.1 kg)  11/22/21 191 lb (86.6 kg)       Assessment & Plan:  Preventative health care Assessment & Plan: Continue healthy diet, exercise and weight loss efforts.  Mammo due- ordered.  Pap/colo/immunizations reviewed and up to date. Recommended that  she get flu shot and covid booster this fall.    Breast cancer screening by mammogram -     3D Screening Mammogram, Left and Right; Future  Pure hypercholesterolemia -     Comprehensive metabolic panel -     Lipid panel  History of anemia -     CBC with Differential/Platelet  Hyperthyroidism Assessment & Plan: Maintained on tapazole per Endo. Stable.  Lab Results  Component Value Date   TSH 1.98 04/25/2022      Type 2 diabetes mellitus with hyperglycemia, without long-term current use of insulin (HCC) Assessment & Plan: Maintained on Rybelsus/glipizide.   Lab Results  Component Value Date   HGBA1C 6.8 (A) 04/25/2022   Last A1C much improved. Management per endocrinology.    Depression with anxiety Assessment & Plan: No current concerns about mood.     Dyslipidemia Assessment & Plan: Lab Results  Component Value Date   CHOL 155 05/21/2021   HDL 47.40 05/21/2021   LDLCALC 82 05/21/2021   TRIG 129.0 05/21/2021   CHOLHDL 3 05/21/2021   LDL at Goal last year. Will update. Continue atorvastatin.    Migraine without status migrainosus, not intractable, unspecified migraine type Assessment & Plan: One recent migraine- resolved on her own. She tries to avoid medication if possible. Monitor.       I,Rachel Rivera,acting as a Neurosurgeon for Lemont Fillers, NP.,have documented all relevant documentation on the behalf of Lemont Fillers, NP,as directed by  Lemont Fillers, NP while in the presence of Travis Mastel  Arvil Chaco, NP.   I, Lemont Fillers, NP, personally preformed the services described in this documentation.  All medical record entries made by the scribe were at my direction and in my presence.  I have reviewed the chart and discharge instructions (if applicable) and agree that the record reflects my personal performance and is accurate and complete. 05/31/22   Lemont Fillers, NP

## 2022-05-31 NOTE — Assessment & Plan Note (Signed)
No current concerns about mood.

## 2022-05-31 NOTE — Assessment & Plan Note (Signed)
One recent migraine- resolved on her own. She tries to avoid medication if possible. Monitor.

## 2022-05-31 NOTE — Assessment & Plan Note (Signed)
Maintained on Rybelsus/glipizide.   Lab Results  Component Value Date   HGBA1C 6.8 (A) 04/25/2022   Last A1C much improved. Management per endocrinology.

## 2022-05-31 NOTE — Assessment & Plan Note (Signed)
Continue healthy diet, exercise and weight loss efforts.  Mammo due- ordered.  Pap/colo/immunizations reviewed and up to date. Recommended that she get flu shot and covid booster this fall.

## 2022-06-01 ENCOUNTER — Other Ambulatory Visit: Payer: Self-pay | Admitting: Family

## 2022-06-01 NOTE — Telephone Encounter (Signed)
Pt is mildly anemic. Please add MVI with minerals once daily. Also,  cholesterol is slightly above goal. Please increase atorvastatin to 20mg .  I have pended rx. Please send to pt's preferred pharmacy.

## 2022-06-06 MED ORDER — MULTI-VITAMIN/MINERALS PO TABS: 1.0000 | ORAL_TABLET | Freq: Every day | ORAL | Status: AC

## 2022-06-06 MED ORDER — ATORVASTATIN CALCIUM 20 MG PO TABS: 20.0000 mg | ORAL_TABLET | Freq: Every day | ORAL | 1 refills | Status: DC

## 2022-06-06 NOTE — Telephone Encounter (Signed)
Patient notified of message and she agrees with plan of care. Rx sent

## 2022-06-08 ENCOUNTER — Encounter: Payer: Self-pay | Admitting: Family

## 2022-06-09 ENCOUNTER — Ambulatory Visit (HOSPITAL_BASED_OUTPATIENT_CLINIC_OR_DEPARTMENT_OTHER)
Admission: RE | Admit: 2022-06-09 | Discharge: 2022-06-09 | Disposition: A | Discharge status: Home / Self Care | Payer: 59 | Source: Ambulatory Visit | Attending: Family | Admitting: Family

## 2022-06-09 ENCOUNTER — Encounter (HOSPITAL_BASED_OUTPATIENT_CLINIC_OR_DEPARTMENT_OTHER): Payer: Self-pay

## 2022-06-09 DIAGNOSIS — Z1231 Encounter for screening mammogram for malignant neoplasm of breast: Secondary | ICD-10-CM | POA: Insufficient documentation

## 2022-06-21 ENCOUNTER — Other Ambulatory Visit: Payer: Self-pay | Admitting: Internal Medicine

## 2022-06-21 ENCOUNTER — Other Ambulatory Visit: Payer: Self-pay | Admitting: Cardiology

## 2022-07-31 ENCOUNTER — Other Ambulatory Visit: Payer: Self-pay | Admitting: Cardiology

## 2022-08-09 ENCOUNTER — Other Ambulatory Visit: Payer: Self-pay | Admitting: Internal Medicine

## 2022-11-02 ENCOUNTER — Ambulatory Visit: Payer: 59 | Admitting: Internal Medicine

## 2022-11-14 ENCOUNTER — Encounter: Payer: Self-pay | Admitting: Internal Medicine

## 2022-11-14 ENCOUNTER — Ambulatory Visit (INDEPENDENT_AMBULATORY_CARE_PROVIDER_SITE_OTHER): Payer: 59 | Admitting: Internal Medicine

## 2022-11-14 VITALS — BP 120/80 | HR 85 | Ht 69.0 in | Wt 197.0 lb

## 2022-11-14 DIAGNOSIS — E0590 Thyrotoxicosis, unspecified without thyrotoxic crisis or storm: Secondary | ICD-10-CM | POA: Diagnosis not present

## 2022-11-14 DIAGNOSIS — E785 Hyperlipidemia, unspecified: Secondary | ICD-10-CM | POA: Diagnosis not present

## 2022-11-14 DIAGNOSIS — E042 Nontoxic multinodular goiter: Secondary | ICD-10-CM | POA: Diagnosis not present

## 2022-11-14 DIAGNOSIS — Z7984 Long term (current) use of oral hypoglycemic drugs: Secondary | ICD-10-CM | POA: Diagnosis not present

## 2022-11-14 DIAGNOSIS — E119 Type 2 diabetes mellitus without complications: Secondary | ICD-10-CM

## 2022-11-14 LAB — LIPID PANEL
Cholesterol: 205 mg/dL — ABNORMAL HIGH (ref 0–200)
HDL: 57.8 mg/dL (ref >39.00)
LDL Cholesterol: 131 mg/dL — ABNORMAL HIGH (ref 0–99)
NonHDL: 146.84
Total CHOL/HDL Ratio: 4
Triglycerides: 78 mg/dL (ref 0.0–149.0)
VLDL: 15.6 mg/dL (ref 0.0–40.0)

## 2022-11-14 LAB — POCT GLYCOSYLATED HEMOGLOBIN (HGB A1C): Hemoglobin A1C: 8.4 % — AB (ref 4.0–5.6)

## 2022-11-14 LAB — T4, FREE: Free T4: 0.8 ng/dL (ref 0.60–1.60)

## 2022-11-14 LAB — TSH: TSH: 3.67 u[IU]/mL (ref 0.35–5.50)

## 2022-11-14 MED ORDER — RYBELSUS 14 MG PO TABS: 14.0000 mg | ORAL_TABLET | Freq: Every day | ORAL | 3 refills | Status: DC

## 2022-11-14 MED ORDER — GLIPIZIDE 10 MG PO TABS: ORAL_TABLET | ORAL | 3 refills | Status: DC

## 2022-11-14 NOTE — Progress Notes (Unsigned)
Name: Sarah Ibarra  Age/ Sex: 57 y.o., female   MRN/ DOB: [id number], 10/30/1965     PCP: Sandford Craze, NP   Reason for Endocrinology Evaluation: Type 2 Diabetes Mellitus  Initial Endocrine Consultative Visit: 12/12/2018    PATIENT IDENTIFIER: Sarah Ibarra is a 57 y.o. female with a past medical history of T2DM and Dyslipidemia. The patient has followed with Endocrinology clinic since 12/12/2018 for consultative assistance with management of her diabetes.  DIABETIC HISTORY:  [name] was diagnosed with DM in 2009, she has been on Metformin since diagnosis.Her hemoglobin A1c has ranged from 8.3% in 2019, peaking at 12.5% in 2020 On her initial visit to our clinic she had an A1c of 12.5%, she declined insulin at the time and opted for Glipizide. We stopped Metformin due to nausea   Farxiga started 01/2020 but stopped Farxiga 2023 due to cost of $1400, attempted to prescribe Jardiance April 2023 which was also cost prohibitive   Rybelsus was started 10/2021  THYROID HISTORY:  Pt has been noted to have suppressed TSH < 0.01 uIU/mL with elevated FT4 3.11 ng/dL during routine workup. She was noted to have weight loss at the time of her presentation but no other symptoms. She was started on Methimazole 11/2018 TRAB level slightly elevated at 4.91 IU/L    Mother with thyroid disease  SUBJECTIVE:   During the last visit (04/25/2022): A1c 6.8 %     Today (11/14/2022): [name] is here for a follow up on diabetes.  She checks her blood sugars 1 times daily. The patient has had hypoglycemic episodes since the last clinic visit.   Denies nausea or vomiting  Denies constipation or  Diarrhea Denies palpitations Denies local neck swelling    HOME ENDOCRINE REGIMEN:  Glipizide 10 mg, 1.5 tabs before breakfast and 1 tab before supper Rybelsus 7 mg daily Methimazole 5 mg daily     Statin: Yes ACE-I/ARB: No    METER DOWNLOAD SUMMARY: [place] 133 - 189 mg/dL     DIABETIC COMPLICATIONS: Microvascular complications:   Denies: CKD , neuropathy, retinopathy  Last Eye Exam: Completed  10/2021  Macrovascular complications:   Denies: CAD, CVA, PVD   HISTORY:  Past Medical History:  Past Medical History:  Diagnosis Date   Arthritis    low back pain   Chicken pox as a child   Depression with anxiety    pt denies   Diabetes mellitus without complication (HCC)    Frequent headaches    Hx: UTI (urinary tract infection)    Hyperlipidemia    Hyperthyroidism 10/08/2018   Migraines    Mumps as a child   Overweight    UTI (urinary tract infection)    Past Surgical History:  Past Surgical History:  Procedure Laterality Date   CARDIOVERSION  12/13/2018   CESAREAN SECTION  1993   TUBAL LIGATION  1993   Social History:  reports that she has never smoked. She has never used smokeless tobacco. She reports that she does not drink alcohol and does not use drugs. Family History:  Family History  Problem Relation Age of Onset   Other Mother        low blood pressure   Hyperlipidemia Mother    Multiple sclerosis Son    Stroke Maternal Grandmother    Diabetes Maternal Grandmother        type 2   Alzheimer's disease Paternal Grandfather    Colon cancer Neg Hx    Colon polyps Neg Hx  Esophageal cancer Neg Hx    Stomach cancer Neg Hx    Rectal cancer Neg Hx      HOME MEDICATIONS: Allergies as of 11/14/2022       Reactions   Morphine And Codeine Hives        Medication List        Accurate as of November 14, 2022  1:01 PM. If you have any questions, ask your nurse or doctor.          atorvastatin 20 MG tablet Commonly known as: LIPITOR Take 1 tablet (20 mg total) by mouth daily.   glipiZIDE 10 MG tablet Commonly known as: GLUCOTROL Take 1.5 tablets (15 mg total) by mouth daily before breakfast AND 1 tablet (10 mg total) daily before supper.   methimazole 5 MG tablet Commonly known as: TAPAZOLE TAKE 1 TABLET BY MOUTH  DAILY   metoprolol tartrate 25 MG tablet Commonly known as: LOPRESSOR Take 1 tablet (25 mg total) by mouth 2 (two) times daily.   multivitamin with minerals tablet Take 1 tablet by mouth daily.   Rybelsus 7 MG Tabs Generic drug: Semaglutide Take 1 tablet (7 mg total) by mouth daily.         OBJECTIVE:   Vital Signs: LMP 04/06/2012   Wt Readings from Last 3 Encounters:  05/31/22 197 lb (89.4 kg)  04/25/22 192 lb (87.1 kg)  11/22/21 191 lb (86.6 kg)     Exam: General: Pt appears well and is in NAD  Neck: General: Supple without adenopathy. Thyroid: Prominent thyroid   Lungs: Clear with good BS   Heart: RRR   Extremities: No pretibial edema.   Neuro: MS is good with appropriate affect, pt is alert and Ox3    DM foot exam: 04/25/2022  The skin of the feet is without sores or ulcerations. The pedal pulses are 2+ on right and 2+ on left. The sensation is intact to a screening 5.07, 10 gram monofilament bilaterally    DATA REVIEWED:  Lab Results  Component Value Date   HGBA1C 6.8 (A) 04/25/2022   HGBA1C 9.9 (A) 10/22/2021   HGBA1C 7.3 (A) 04/22/2021     Latest Reference Range & Units 04/25/22 09:14  TSH 0.35 - 5.50 uIU/mL 1.98  Triiodothyronine (T3) 76 - 181 ng/dL 811  B1,YNWG(NFAOZH) 0.86 - 1.60 ng/dL 5.78     Latest Reference Range & Units 09/07/21 14:43  Sodium 135 - 145 mmol/L 136  Potassium 3.5 - 5.1 mmol/L 4.0  Chloride 98 - 111 mmol/L 103  CO2 22 - 32 mmol/L 25  Glucose 70 - 99 mg/dL 469 (H)  BUN 6 - 20 mg/dL 17  Creatinine 6.29 - 5.28 mg/dL 4.13  Calcium 8.9 - 24.4 mg/dL 9.6  Anion gap 5 - 15  8  GFR, Estimated >60 mL/min >60    Latest Reference Range & Units 11/22/21 09:16  Creatinine,U mg/dL 010.2  Microalb, Ur 0.0 - 1.9 mg/dL 0.9  MICROALB/CREAT RATIO 0.0 - 30.0 mg/g 0.5        Latest Reference Range & Units Most Recent  TRAB <=2.00 IU/L 4.91 (H) 12/12/18 09:38      Thyroid ultrasound 12/02/2021  Estimated total number of  nodules >/= 1 cm: 1   Number of spongiform nodules >/=  2 cm not described below (TR1): 0   Number of mixed cystic and solid nodules >/= 1.5 cm not described below (TR2): 0   _________________________________________________________   Nodule # 1: Decreasing size of solid nodule in  the superior aspect of the thyroid isthmus. The nodule no longer meets criteria for imaging surveillance.   Additional tiny nodules seen bilaterally. None meet criteria to warrant imaging surveillance.   IMPRESSION: 1. Interval involution of the TI-RADS category 4 nodule in the superior isthmus. Involution over time is consistent with benignity. No further follow-up recommended. 2. Similar appearance of enlarged, heterogeneous multinodular thyroid gland. All remaining thyroid nodules are tiny and do not meet criteria for surveillance.  ASSESSMENT / PLAN / RECOMMENDATIONS:   1) Type 2 Diabetes Mellitus, Optimally controlled, With out complications - Most recent A1c of 8.4 %. Goal A1c <7.0%.  - A1c has trended up, patient attributes this to dietary indiscretions - SGLT-2 inhibtors have been cost prohibitive  -Will increase glipizide as below, by increasing morning dose, no changes to the evening dose of glipizide -Will increase Rybelsus as below   MEDICATIONS: Change glipizide 10 mg to 2 tablets before Breakfast and ONE tablet  before supper   tablet  Increase  Rybelsus 14 mg daily     EDUCATION / INSTRUCTIONS: BG monitoring instructions: Patient is instructed to check her blood sugars 1 times a day, alternating between fasting and supper. Call [facility] if: BG persistently < 70  I reviewed the Rule of 15 for the treatment of hypoglycemia in detail with the patient. Literature supplied.     2) Diabetic complications:  Eye: Does night have known diabetic retinopathy.  Neuro/ Feet: Does not have known diabetic peripheral neuropathy .  Renal: Patient does not have known  baseline CKD. She   is not on an ACEI/ARB at present.  Normal MA/CR ratio.    3) Graves' Disease:    - She was on Methimazole for approximately a year 11/2018 to 10/2019, this was resumed in October 2023 with a suppressed TSH <0.01 uIU/mL - No extrathyroidal manifestations of Graves' disease  -Repeat TFTs ****  Medication Continue methimazole 5 mg daily   4) Multinodular goiter:  -No local neck symptoms -Her last thyroid ultrasound 11/2021 showed involution  of category 4 nodule isthmic noule, and the rest of the nodules did not meet criteria for a follow-up   5) Dyslipidemia :  -LDL above goal -Discussed the increased risk of CVA/CAD with elevated LDL -Assures me compliance with statin therapy  Medication Atorvastatin 20 mg daily    F/U in 6 months   Signed electronically by: Lyndle Herrlich, MD  [facility] End[facility]  [facility] [address] [place] [place], [place] [address] Phone: [phone] FAX: [phone]   CC: Sandford Craze, NP [address] [place] [address] Phone: [phone]  Fax: [phone]  Return to Endocrinology clinic as below: Future Appointments  Date Time Provider Department Center  11/14/2022  1:20 PM Jaelah Hauth, Konrad Dolores, MD [id number] None

## 2022-11-14 NOTE — Patient Instructions (Signed)
-   Change Glipizide 10 mg, TWO tablets  before Breakfast and before ONE tablet before Supper  - Increase  Rybelsus 14 mg ,1 tablet before Breakfast     HOW TO TREAT LOW BLOOD SUGARS (Blood sugar LESS THAN 70 MG/DL) Please follow the RULE OF 15 for the treatment of hypoglycemia treatment (when your (blood sugars are less than 70 mg/dL)   STEP 1: Take 15 grams of carbohydrates when your blood sugar is low, which includes:  3-4 GLUCOSE TABS  OR 3-4 OZ OF JUICE OR REGULAR SODA OR ONE TUBE OF GLUCOSE GEL    STEP 2: RECHECK blood sugar in 15 MINUTES STEP 3: If your blood sugar is still low at the 15 minute recheck --> then, go back to STEP 1 and treat AGAIN with another 15 grams of carbohydrates.

## 2022-11-15 ENCOUNTER — Telehealth: Payer: Self-pay

## 2022-11-15 ENCOUNTER — Telehealth: Payer: Self-pay | Admitting: Internal Medicine

## 2022-11-15 MED ORDER — METHIMAZOLE 5 MG PO TABS: 5.0000 mg | ORAL_TABLET | ORAL | 2 refills | Status: DC

## 2022-11-15 MED ORDER — ATORVASTATIN CALCIUM 40 MG PO TABS: 40.0000 mg | ORAL_TABLET | Freq: Every day | ORAL | 3 refills | Status: DC

## 2022-11-15 NOTE — Telephone Encounter (Signed)
VM left requesting callback to relay labs and med changes as directed by MD.

## 2022-11-15 NOTE — Telephone Encounter (Signed)
Patient advised and will make medication adjustments

## 2022-11-15 NOTE — Telephone Encounter (Signed)
Please let the patient know that her cholesterol has worsened, I have increased atorvastatin from 20 mg to 40 mg.   She may double up on the ones  she has now at home, once she is done, the new prescription will be already 40 mg and she will take 1 tablet of those daily   Let her know that her thyroid is normal, but we do have room to decrease her methimazole, please take 1 tablet of methimazole Monday through Saturday and none on Sundays.   Thanks

## 2022-11-17 LAB — HM DIABETES EYE EXAM

## 2022-12-21 ENCOUNTER — Other Ambulatory Visit: Payer: Self-pay | Admitting: Internal Medicine

## 2023-02-23 ENCOUNTER — Encounter: Payer: Self-pay | Admitting: Internal Medicine

## 2023-02-23 ENCOUNTER — Ambulatory Visit: Payer: 59 | Admitting: Internal Medicine

## 2023-02-23 VITALS — BP 124/80 | HR 83 | Ht 69.0 in | Wt 194.0 lb

## 2023-02-23 DIAGNOSIS — E0590 Thyrotoxicosis, unspecified without thyrotoxic crisis or storm: Secondary | ICD-10-CM

## 2023-02-23 DIAGNOSIS — Z7984 Long term (current) use of oral hypoglycemic drugs: Secondary | ICD-10-CM

## 2023-02-23 DIAGNOSIS — E119 Type 2 diabetes mellitus without complications: Secondary | ICD-10-CM | POA: Diagnosis not present

## 2023-02-23 DIAGNOSIS — E785 Hyperlipidemia, unspecified: Secondary | ICD-10-CM | POA: Diagnosis not present

## 2023-02-23 LAB — COMPREHENSIVE METABOLIC PANEL
AG Ratio: 1.1 (calc) (ref 1.0–2.5)
ALT: 18 U/L (ref 6–29)
AST: 17 U/L (ref 10–35)
Albumin: 4 g/dL (ref 3.6–5.1)
Alkaline phosphatase (APISO): 75 U/L (ref 37–153)
BUN: 12 mg/dL (ref 7–25)
CO2: 28 mmol/L (ref 20–32)
Calcium: 9.4 mg/dL (ref 8.6–10.4)
Chloride: 104 mmol/L (ref 98–110)
Creat: 0.86 mg/dL (ref 0.50–1.03)
Globulin: 3.6 g/dL (ref 1.9–3.7)
Glucose, Bld: 144 mg/dL — ABNORMAL HIGH (ref 65–99)
Potassium: 4.2 mmol/L (ref 3.5–5.3)
Sodium: 139 mmol/L (ref 135–146)
Total Bilirubin: 0.5 mg/dL (ref 0.2–1.2)
Total Protein: 7.6 g/dL (ref 6.1–8.1)

## 2023-02-23 LAB — POCT GLYCOSYLATED HEMOGLOBIN (HGB A1C): Hemoglobin A1C: 8.4 % — AB (ref 4.0–5.6)

## 2023-02-23 LAB — T4, FREE: Free T4: 1.2 ng/dL (ref 0.8–1.8)

## 2023-02-23 LAB — TSH: TSH: 5.61 m[IU]/L — ABNORMAL HIGH (ref 0.40–4.50)

## 2023-02-23 LAB — LIPID PANEL
Cholesterol: 152 mg/dL (ref <200)
HDL: 52 mg/dL (ref >=50)
LDL Cholesterol (Calc): 81 mg/dL
Non-HDL Cholesterol (Calc): 100 mg/dL (ref <130)
Total CHOL/HDL Ratio: 2.9 (calc) (ref <5.0)
Triglycerides: 93 mg/dL (ref <150)

## 2023-02-23 LAB — MICROALBUMIN / CREATININE URINE RATIO
Creatinine, Urine: 259 mg/dL (ref 20–275)
Microalb Creat Ratio: 3 mg/g{creat} (ref <30)
Microalb, Ur: 0.8 mg/dL

## 2023-02-23 MED ORDER — GLIPIZIDE 10 MG PO TABS: 20.0000 mg | ORAL_TABLET | Freq: Two times a day (BID) | ORAL | 3 refills | Status: DC

## 2023-02-23 NOTE — Progress Notes (Signed)
 Name: Sarah Ibarra  Age/ Sex: 58 y.o., female   MRN/ DOB: [id number], 1965/09/13     PCP: Daryl Setter, NP   Reason for Endocrinology Evaluation: Type 2 Diabetes Mellitus  Initial Endocrine Consultative Visit: 12/12/2018    PATIENT IDENTIFIER: Sarah Ibarra is a 58 y.o. female with a past medical history of T2DM and Dyslipidemia. The patient has followed with Endocrinology clinic since 12/12/2018 for consultative assistance with management of her diabetes.  DIABETIC HISTORY:  Sarah Ibarra was diagnosed with DM in 2009, she has been on Metformin  since diagnosis.Her hemoglobin A1c has ranged from 8.3% in 2019, peaking at 12.5% in 2020 On her initial visit to our clinic she had an A1c of 12.5%, she declined insulin  at the time and opted for Glipizide . We stopped Metformin  due to nausea   Farxiga  started 01/2020 but stopped Farxiga  2023 due to cost of $1400, attempted to prescribe Jardiance  April 2023 which was also cost prohibitive   Rybelsus  was started 10/2021  THYROID  HISTORY:  Pt has been noted to have suppressed TSH < 0.01 uIU/mL with elevated FT4 3.11 ng/dL during routine workup. She was noted to have weight loss at the time of her presentation but no other symptoms. She was started on Methimazole  11/2018 TRAB level slightly elevated at 4.91 IU/L    Mother with thyroid  disease  SUBJECTIVE:   During the last visit (11/14/2022): A1c 8.4 %     Today (02/23/2023): Sarah Ibarra is here for a follow up on diabetes.  She checks her blood sugars 1 times daily. The patient has not had hypoglycemic episodes since the last clinic visit.   Denies nausea or vomiting  Denies constipation or  Diarrhea Denies palpitations Denies tremors    HOME ENDOCRINE REGIMEN:  Glipizide  10 mg, 2 tabs before breakfast and 1 tab before supper Rybelsus  14 mg daily Methimazole  5 mg, 1 tablet Monday through Saturday, skip Sundays- daily  Atorvastatin  40 mg daily   Statin: Yes ACE-I/ARB:  No    METER DOWNLOAD SUMMARY: [place]          DIABETIC COMPLICATIONS: Microvascular complications:   Denies: CKD , neuropathy, retinopathy  Last Eye Exam: Completed  11/17/2022  Macrovascular complications:   Denies: CAD, CVA, PVD   HISTORY:  Past Medical History:  Past Medical History:  Diagnosis Date   Arthritis    low back pain   Chicken pox as a child   Depression with anxiety    pt denies   Diabetes mellitus without complication (HCC)    Frequent headaches    Hx: UTI (urinary tract infection)    Hyperlipidemia    Hyperthyroidism 10/08/2018   Migraines    Mumps as a child   Overweight    UTI (urinary tract infection)    Past Surgical History:  Past Surgical History:  Procedure Laterality Date   CARDIOVERSION  12/13/2018   CESAREAN SECTION  1993   TUBAL LIGATION  1993   Social History:  reports that she has never smoked. She has never used smokeless tobacco. She reports that she does not drink alcohol and does not use drugs. Family History:  Family History  Problem Relation Age of Onset   Other Mother        low blood pressure   Hyperlipidemia Mother    Multiple sclerosis Son    Stroke Maternal Grandmother    Diabetes Maternal Grandmother        type 2   Alzheimer's disease Paternal Grandfather    Colon  cancer Neg Hx    Colon polyps Neg Hx    Esophageal cancer Neg Hx    Stomach cancer Neg Hx    Rectal cancer Neg Hx      HOME MEDICATIONS: Allergies as of 02/23/2023       Reactions   Morphine And Codeine Hives        Medication List        Accurate as of February 23, 2023  9:53 AM. If you have any questions, ask your nurse or doctor.          atorvastatin  40 MG tablet Commonly known as: LIPITOR Take 1 tablet (40 mg total) by mouth daily.   azithromycin 250 MG tablet Commonly known as: ZITHROMAX Take by mouth.   glipiZIDE  10 MG tablet Commonly known as: GLUCOTROL  Take 2 tablets (20 mg total) by mouth daily before  breakfast AND 1 tablet (10 mg total) daily before supper. What changed: See the new instructions.   methimazole  5 MG tablet Commonly known as: TAPAZOLE  Take 1 tablet (5 mg total) by mouth as directed. 1 tablet Monday through Saturday and none on Sundays   metoprolol  tartrate 25 MG tablet Commonly known as: LOPRESSOR  Take 1 tablet (25 mg total) by mouth 2 (two) times daily.   multivitamin with minerals tablet Take 1 tablet by mouth daily.   promethazine-dextromethorphan 6.25-15 MG/5ML syrup Commonly known as: PROMETHAZINE-DM Take 5 mLs by mouth 4 (four) times daily as needed.   Rybelsus  14 MG Tabs Generic drug: Semaglutide  Take 1 tablet (14 mg total) by mouth daily.         OBJECTIVE:   Vital Signs: BP 124/80 (BP Location: Left Arm, Patient Position: Sitting, Cuff Size: Normal)   Pulse 83   Ht 5' 9 (1.753 m)   Wt 194 lb (88 kg)   LMP 04/06/2012   SpO2 99%   BMI 28.65 kg/m    Wt Readings from Last 3 Encounters:  02/23/23 194 lb (88 kg)  11/14/22 197 lb (89.4 kg)  05/31/22 197 lb (89.4 kg)     Exam: General: Pt appears well and is in NAD  Neck: General: Supple without adenopathy. Thyroid : Prominent thyroid    Lungs: Clear with good BS   Heart: RRR   Extremities: No pretibial edema.   Neuro: MS is good with appropriate affect, pt is alert and Ox3    DM foot exam: 02/23/2023  The skin of the feet is without sores or ulcerations. The pedal pulses are 2+ on right and 2+ on left. The sensation is intact to a screening 5.07, 10 gram monofilament bilaterally    DATA REVIEWED:  Lab Results  Component Value Date   HGBA1C 8.4 (A) 11/14/2022   HGBA1C 6.8 (A) 04/25/2022   HGBA1C 9.9 (A) 10/22/2021    Latest Reference Range & Units 02/23/23 10:16  Sodium 135 - 146 mmol/L 139  Potassium 3.5 - 5.3 mmol/L 4.2  Chloride 98 - 110 mmol/L 104  CO2 20 - 32 mmol/L 28  Glucose 65 - 99 mg/dL 855 (H)  BUN 7 - 25 mg/dL 12  Creatinine 9.49 - 8.96 mg/dL 9.13  Calcium   8.6 - 10.4 mg/dL 9.4  BUN/Creatinine Ratio 6 - 22 (calc) SEE NOTE:  AG Ratio 1.0 - 2.5 (calc) 1.1  AST 10 - 35 U/L 17  ALT 6 - 29 U/L 18  Total Protein 6.1 - 8.1 g/dL 7.6  Total Bilirubin 0.2 - 1.2 mg/dL 0.5  Total CHOL/HDL Ratio <5.0 (calc) 2.9  Cholesterol <  200 mg/dL 847  HDL Cholesterol > OR = 50 mg/dL 52  LDL Cholesterol (Calc) mg/dL (calc) 81  MICROALB/CREAT RATIO <30 mg/g creat 3  Non-HDL Cholesterol (Calc) <130 mg/dL (calc) 899  Triglycerides <150 mg/dL 93    Latest Reference Range & Units 02/23/23 10:16  TSH 0.40 - 4.50 mIU/L 5.61 (H)  T4,Free(Direct) 0.8 - 1.8 ng/dL 1.2  Albumin MSPROF 3.6 - 5.1 g/dL 4.0    Latest Reference Range & Units 02/23/23 10:16  Microalb, Ur mg/dL 0.8  MICROALB/CREAT RATIO <30 mg/g creat 3  Creatinine, Urine 20 - 275 mg/dL 740        Latest Reference Range & Units Most Recent  TRAB <=2.00 IU/L 4.91 (H) 12/12/18 09:38      Thyroid  ultrasound 12/02/2021  Estimated total number of nodules >/= 1 cm: 1   Number of spongiform nodules >/=  2 cm not described below (TR1): 0   Number of mixed cystic and solid nodules >/= 1.5 cm not described below (TR2): 0   _________________________________________________________   Nodule # 1: Decreasing size of solid nodule in the superior aspect of the thyroid  isthmus. The nodule no longer meets criteria for imaging surveillance.   Additional tiny nodules seen bilaterally. None meet criteria to warrant imaging surveillance.   IMPRESSION: 1. Interval involution of the TI-RADS category 4 nodule in the superior isthmus. Involution over time is consistent with benignity. No further follow-up recommended. 2. Similar appearance of enlarged, heterogeneous multinodular thyroid  gland. All remaining thyroid  nodules are tiny and do not meet criteria for surveillance.  ASSESSMENT / PLAN / RECOMMENDATIONS:   1) Type 2 Diabetes Mellitus, Optimally controlled, With out complications - Most recent A1c of  8.4 %. Goal A1c <7.0%.  - A1c remains above goal despite increasing glipizide  and Rybelsus  -Patient assures me compliance, will emphasize the importance of low-carb diet, avoiding snacks and avoiding sugar sweetened beverages - SGLT-2 inhibtors have been cost prohibitive  -I have recommended switching Rybelsus  to Mounjaro, but she is not keen on doing injections at this time -I will increase her glipizide , she understands there will be the maximum dose for glipizide , and if hyperglycemia continues we have no other option but to add a third agent   MEDICATIONS: Change glipizide  10 mg to 2 tablets before Breakfast and  before supper   Continue Rybelsus  14 mg daily     EDUCATION / INSTRUCTIONS: BG monitoring instructions: Patient is instructed to check her blood sugars 1 times a day, alternating between fasting and supper. Call [facility] if: BG persistently < 70  I reviewed the Rule of 15 for the treatment of hypoglycemia in detail with the patient. Literature supplied.     2) Diabetic complications:  Eye: Does night have known diabetic retinopathy.  Neuro/ Feet: Does not have known diabetic peripheral neuropathy .  Renal: Patient does not have known baseline CKD. She   is not on an ACEI/ARB at present.  Normal MA/CR ratio.    3) Graves' Disease:    - She was on Methimazole  for approximately a year 11/2018 to 10/2019, this was resumed in October 2023 with a suppressed TSH <0.01 uIU/mL - No extrathyroidal manifestations of Graves' disease -I have asked her to decrease methimazole  in the past but she continues to take it daily rather than 6 days a week  -Repeat TFTs ,   Medication Decrease Methimazole  5 mg, 1 tablet Monday through Friday , skip Saturday and Sunday    4) Dyslipidemia :  -Discussed  the increased risk of CVA/CAD with elevated LDL -She assures me compliance with atorvastatin  -Lipid panel at goal   Medication  Continue atorvastatin  40 mg  daily    F/U in 4 months   Signed electronically by: Stefano Redgie Butts, MD  [facility] Endocrinology  [facility] [address] [place] [place], [place] [address] Phone: [phone] FAX: [phone]   CC: Daryl Setter, NP [address] [place] [address] Phone: [phone]  Fax: [phone]  Return to Endocrinology clinic as below: No future appointments.

## 2023-02-23 NOTE — Patient Instructions (Signed)
-   Change Glipizide  10 mg, TWO tablets  before Breakfast and TWO tablets before Supper  - Continue  Rybelsus  14 mg ,1 tablet before Breakfast     HOW TO TREAT LOW BLOOD SUGARS (Blood sugar LESS THAN 70 MG/DL) Please follow the RULE OF 15 for the treatment of hypoglycemia treatment (when your (blood sugars are less than 70 mg/dL)   STEP 1: Take 15 grams of carbohydrates when your blood sugar is low, which includes:  3-4 GLUCOSE TABS  OR 3-4 OZ OF JUICE OR REGULAR SODA OR ONE TUBE OF GLUCOSE GEL    STEP 2: RECHECK blood sugar in 15 MINUTES STEP 3: If your blood sugar is still low at the 15 minute recheck --> then, go back to STEP 1 and treat AGAIN with another 15 grams of carbohydrates.

## 2023-02-24 ENCOUNTER — Telehealth: Payer: Self-pay | Admitting: Internal Medicine

## 2023-02-24 MED ORDER — METHIMAZOLE 5 MG PO TABS: 5.0000 mg | ORAL_TABLET | ORAL | 3 refills | Status: DC

## 2023-02-24 NOTE — Telephone Encounter (Signed)
 LVM and sent mychart message

## 2023-02-24 NOTE — Telephone Encounter (Signed)
 Please let the patient know that she is on too much methimazole , and her thyroid  is underactive at this time.   Please advise the patient to take methimazole  Monday through Friday and none on Saturdays or Sundays from now on.  Encourage the patient to use a pillbox if she is not using one  The rest of her labs show normal kidney function, normal urine test, and normal cholesterol

## 2023-05-10 ENCOUNTER — Other Ambulatory Visit: Payer: Self-pay

## 2023-05-10 MED ORDER — GLIPIZIDE 10 MG PO TABS
20.0000 mg | ORAL_TABLET | Freq: Two times a day (BID) | ORAL | 3 refills | Status: DC
Start: 2023-05-10 — End: 2023-06-27

## 2023-05-18 ENCOUNTER — Other Ambulatory Visit: Payer: Self-pay

## 2023-05-18 MED ORDER — RYBELSUS 14 MG PO TABS: 14.0000 mg | ORAL_TABLET | Freq: Every day | ORAL | 3 refills | Status: DC

## 2023-06-27 ENCOUNTER — Telehealth: Payer: Self-pay | Admitting: Internal Medicine

## 2023-06-27 ENCOUNTER — Encounter: Payer: Self-pay | Admitting: Internal Medicine

## 2023-06-27 ENCOUNTER — Telehealth (INDEPENDENT_AMBULATORY_CARE_PROVIDER_SITE_OTHER): Payer: 59 | Admitting: Internal Medicine

## 2023-06-27 VITALS — Ht 69.0 in

## 2023-06-27 DIAGNOSIS — E785 Hyperlipidemia, unspecified: Secondary | ICD-10-CM | POA: Diagnosis not present

## 2023-06-27 DIAGNOSIS — E0590 Thyrotoxicosis, unspecified without thyrotoxic crisis or storm: Secondary | ICD-10-CM

## 2023-06-27 DIAGNOSIS — Z8639 Personal history of other endocrine, nutritional and metabolic disease: Secondary | ICD-10-CM

## 2023-06-27 DIAGNOSIS — E119 Type 2 diabetes mellitus without complications: Secondary | ICD-10-CM | POA: Diagnosis not present

## 2023-06-27 MED ORDER — ATORVASTATIN CALCIUM 40 MG PO TABS: 40.0000 mg | ORAL_TABLET | Freq: Every day | ORAL | 3 refills | Status: DC

## 2023-06-27 MED ORDER — METHIMAZOLE 5 MG PO TABS: 5.0000 mg | ORAL_TABLET | ORAL | 3 refills | Status: DC

## 2023-06-27 MED ORDER — RYBELSUS 14 MG PO TABS: 14.0000 mg | ORAL_TABLET | Freq: Every day | ORAL | 3 refills | Status: DC

## 2023-06-27 MED ORDER — GLIPIZIDE 10 MG PO TABS: 20.0000 mg | ORAL_TABLET | Freq: Two times a day (BID) | ORAL | 3 refills | Status: DC

## 2023-06-27 NOTE — Telephone Encounter (Signed)
 Please contact the patient and schedule for follow-up appointment to see me in 4 months for diabetes   Thanks

## 2023-06-27 NOTE — Progress Notes (Signed)
 Virtual Visit via Video Note  I connected with Sarah Ibarra on 06/27/23 at 10:30 AM EDT by a video enabled telemedicine application and verified that I am speaking with the correct person using two identifiers.   I discussed the limitations of evaluation and management by telemedicine and the availability of in person appointments. The patient expressed understanding and agreed to proceed.   -Location of the patient : Home -Location of the provider : Office -The names of all persons participating in the telemedicine service : Pt and myself        Name: Sarah Ibarra  Age/ Sex: 58 y.o., female   MRN/ DOB: [id number], November 27, 1965     PCP: Dorrene Gaucher, NP   Reason for Endocrinology Evaluation: Type 2 Diabetes Mellitus  Initial Endocrine Consultative Visit: 12/12/2018    PATIENT IDENTIFIER: [name] is a 58 y.o. female with a past medical history of T2DM and Dyslipidemia. The patient has followed with Endocrinology clinic since 12/12/2018 for consultative assistance with management of her diabetes.  DIABETIC HISTORY:  [name] was diagnosed with DM in 2009, she has been on Metformin  since diagnosis.Her hemoglobin A1c has ranged from 8.3% in 2019, peaking at 12.5% in 2020 On her initial visit to our clinic she had an A1c of 12.5%, she declined insulin  at the time and opted for Glipizide . We stopped Metformin  due to nausea   Farxiga  started 01/2020 but stopped Farxiga  2023 due to cost of $1400, attempted to prescribe Jardiance  April 2023 which was also cost prohibitive   Rybelsus  was started 10/2021  THYROID  HISTORY:  Pt has been noted to have suppressed TSH < 0.01 uIU/mL with elevated FT4 3.11 ng/dL during routine workup. She was noted to have weight loss at the time of her presentation but no other symptoms. She was started on Methimazole  11/2018 TRAB level slightly elevated at 4.91 IU/L    Mother with thyroid  disease  SUBJECTIVE:   During the last visit  (02/23/2023): A1c 8.4 %     Today (06/27/2023): [name] is here for a follow up on diabetes.  She checks her blood sugars 1 times daily. The patient has not had hypoglycemic episodes since the last clinic visit.  The patient has been having headaches for 2 days that she attributes to stress, no fever or sinus congestion Denies nausea or vomiting Denies constipation or diarrhea Has noted weight loss No palpitations   HOME ENDOCRINE REGIMEN:  Glipizide  10 mg, 2 tabs before breakfast and 2 tab before supper Rybelsus  14 mg daily Methimazole  5 mg,  1 tablet Monday through Friday , skip Saturday and Sunday  Atorvastatin  40 mg daily    Statin: Yes ACE-I/ARB: No    METER DOWNLOAD SUMMARY:  06/27/2023 150 06/26/2023 143 06/25/2023 130      DIABETIC COMPLICATIONS: Microvascular complications:   Denies: CKD , neuropathy, retinopathy  Last Eye Exam: Completed  11/17/2022  Macrovascular complications:   Denies: CAD, CVA, PVD   HISTORY:  Past Medical History:  Past Medical History:  Diagnosis Date   Arthritis    low back pain   Chicken pox as a child   Depression with anxiety    pt denies   Diabetes mellitus without complication (HCC)    Frequent headaches    Hx: UTI (urinary tract infection)    Hyperlipidemia    Hyperthyroidism 10/08/2018   Migraines    Mumps as a child   Overweight    UTI (urinary tract infection)    Past Surgical History:  Past Surgical History:  Procedure Laterality Date   CARDIOVERSION  12/13/2018   CESAREAN SECTION  1993   TUBAL LIGATION  1993   Social History:  reports that she has never smoked. She has never used smokeless tobacco. She reports that she does not drink alcohol and does not use drugs. Family History:  Family History  Problem Relation Age of Onset   Other Mother        low blood pressure   Hyperlipidemia Mother    Multiple sclerosis Son    Stroke Maternal Grandmother    Diabetes Maternal Grandmother        type 2    Alzheimer's disease Paternal Grandfather    Colon cancer Neg Hx    Colon polyps Neg Hx    Esophageal cancer Neg Hx    Stomach cancer Neg Hx    Rectal cancer Neg Hx      HOME MEDICATIONS: Allergies as of 06/27/2023       Reactions   Morphine And Codeine Hives        Medication List        Accurate as of June 27, 2023  7:16 AM. If you have any questions, ask your nurse or doctor.          atorvastatin  40 MG tablet Commonly known as: LIPITOR Take 1 tablet (40 mg total) by mouth daily.   azithromycin 250 MG tablet Commonly known as: ZITHROMAX Take by mouth.   glipiZIDE  10 MG tablet Commonly known as: GLUCOTROL  Take 2 tablets (20 mg total) by mouth 2 (two) times daily before a meal.   methimazole  5 MG tablet Commonly known as: TAPAZOLE  Take 1 tablet (5 mg total) by mouth as directed. 1 tablet Monday through Friday,  none on Saturdays or Sundays   metoprolol  tartrate 25 MG tablet Commonly known as: LOPRESSOR  Take 1 tablet (25 mg total) by mouth 2 (two) times daily.   multivitamin with minerals tablet Take 1 tablet by mouth daily.   promethazine-dextromethorphan 6.25-15 MG/5ML syrup Commonly known as: PROMETHAZINE-DM Take 5 mLs by mouth 4 (four) times daily as needed.   Rybelsus  14 MG Tabs Generic drug: Semaglutide  Take 1 tablet (14 mg total) by mouth daily.         OBJECTIVE:   Vital Signs: LMP 04/06/2012    Wt Readings from Last 3 Encounters:  02/23/23 194 lb (88 kg)  11/14/22 197 lb (89.4 kg)  05/31/22 197 lb (89.4 kg)     Exam: General: Pt appears well and is in NAD  Neuro: MS is good with appropriate affect, pt is alert and Ox3    DM foot exam: 02/23/2023  The skin of the feet is without sores or ulcerations. The pedal pulses are 2+ on right and 2+ on left. The sensation is intact to a screening 5.07, 10 gram monofilament bilaterally    DATA REVIEWED:  Lab Results  Component Value Date   HGBA1C 8.4 (A) 02/23/2023   HGBA1C 8.4  (A) 11/14/2022   HGBA1C 6.8 (A) 04/25/2022    Latest Reference Range & Units 02/23/23 10:16  Sodium 135 - 146 mmol/L 139  Potassium 3.5 - 5.3 mmol/L 4.2  Chloride 98 - 110 mmol/L 104  CO2 20 - 32 mmol/L 28  Glucose 65 - 99 mg/dL 865 (H)  BUN 7 - 25 mg/dL 12  Creatinine 7.84 - 6.96 mg/dL 2.95  Calcium  8.6 - 10.4 mg/dL 9.4  BUN/Creatinine Ratio 6 - 22 (calc) SEE NOTE:  AG Ratio 1.0 -  2.5 (calc) 1.1  AST 10 - 35 U/L 17  ALT 6 - 29 U/L 18  Total Protein 6.1 - 8.1 g/dL 7.6  Total Bilirubin 0.2 - 1.2 mg/dL 0.5  Total CHOL/HDL Ratio <5.0 (calc) 2.9  Cholesterol <200 mg/dL 960  HDL Cholesterol > OR = 50 mg/dL 52  LDL Cholesterol (Calc) mg/dL (calc) 81  MICROALB/CREAT RATIO <30 mg/g creat 3  Non-HDL Cholesterol (Calc) <130 mg/dL (calc) 454  Triglycerides <150 mg/dL 93    Latest Reference Range & Units 02/23/23 10:16  TSH 0.40 - 4.50 mIU/L 5.61 (H)  T4,Free(Direct) 0.8 - 1.8 ng/dL 1.2  Albumin MSPROF 3.6 - 5.1 g/dL 4.0    Latest Reference Range & Units 02/23/23 10:16  Microalb, Ur mg/dL 0.8  MICROALB/CREAT RATIO <30 mg/g creat 3  Creatinine, Urine 20 - 275 mg/dL 098        Latest Reference Range & Units Most Recent  TRAB <=2.00 IU/L 4.91 (H) 12/12/18 09:38      Thyroid  ultrasound 12/02/2021  Estimated total number of nodules >/= 1 cm: 1   Number of spongiform nodules >/=  2 cm not described below (TR1): 0   Number of mixed cystic and solid nodules >/= 1.5 cm not described below (TR2): 0   _________________________________________________________   Nodule # 1: Decreasing size of solid nodule in the superior aspect of the thyroid  isthmus. The nodule no longer meets criteria for imaging surveillance.   Additional tiny nodules seen bilaterally. None meet criteria to warrant imaging surveillance.   IMPRESSION: 1. Interval involution of the TI-RADS category 4 nodule in the superior isthmus. Involution over time is consistent with benignity. No further follow-up  recommended. 2. Similar appearance of enlarged, heterogeneous multinodular thyroid  gland. All remaining thyroid  nodules are tiny and do not meet criteria for surveillance.  ASSESSMENT / PLAN / RECOMMENDATIONS:   1) Type 2 Diabetes Mellitus, Without complications - Most recent A1c of 8.4 %. Goal A1c <7.0%.  -This was a virtual visit, unable to obtain an A1c -Per patient she has noted improvement in her glucose readings ranging between 80s and 90s, but he recently due to stress her BG's have trended above 100 mg/DL -Historically she has declined switching Rybelsus  to an injectable - SGLT-2 inhibtors have been cost prohibitive   MEDICATIONS: Continue Glipizide  10 mg to 2 tablets before Breakfast and before supper   Continue Rybelsus  14 mg daily     EDUCATION / INSTRUCTIONS: BG monitoring instructions: Patient is instructed to check her blood sugars 1 times a day, alternating between fasting and supper. Call [facility] if: BG persistently < 70  I reviewed the Rule of 15 for the treatment of hypoglycemia in detail with the patient. Literature supplied.     2) Diabetic complications:  Eye: Does night have known diabetic retinopathy.  Neuro/ Feet: Does not have known diabetic peripheral neuropathy .  Renal: Patient does not have known baseline CKD. She   is not on an ACEI/ARB at present.  Normal MA/CR ratio.    3) Graves' Disease:    - She was on Methimazole  for approximately a year 11/2018 to 10/2019, this was resumed in October 2023 with a suppressed TSH <0.01 uIU/mL - No extrathyroidal manifestations of Graves' disease  Medication Continue methimazole  5 mg, 1 tablet Monday through Friday , skip Saturday and Sunday    4) Dyslipidemia :  -Lipid panel at goal - No change   Medication  Continue atorvastatin  40 mg daily    F/U in  4 months   Signed electronically by: Natale Bail, MD  [facility] Endocrinology  [facility] [address] [place], [place] [address] Phone: [phone] FAX: [phone]   CC: Dorrene Gaucher, NP [address] [place] [address] Phone: [phone]  Fax: [phone]  Return to Endocrinology clinic as below: Future Appointments  Date Time Provider Department Center  06/27/2023 10:30 AM Sidney Kann, Julian Obey, MD [id number] None

## 2023-06-27 NOTE — Telephone Encounter (Signed)
 Patient is scheduled for 10/27/2023 for follow up.

## 2023-10-03 ENCOUNTER — Other Ambulatory Visit (HOSPITAL_BASED_OUTPATIENT_CLINIC_OR_DEPARTMENT_OTHER): Payer: Self-pay | Admitting: Family

## 2023-10-03 DIAGNOSIS — Z139 Encounter for screening, unspecified: Secondary | ICD-10-CM

## 2023-10-09 ENCOUNTER — Encounter (HOSPITAL_BASED_OUTPATIENT_CLINIC_OR_DEPARTMENT_OTHER): Payer: Self-pay

## 2023-10-09 ENCOUNTER — Ambulatory Visit (HOSPITAL_BASED_OUTPATIENT_CLINIC_OR_DEPARTMENT_OTHER)
Admission: RE | Admit: 2023-10-09 | Discharge: 2023-10-09 | Disposition: A | Discharge status: Home / Self Care | Source: Ambulatory Visit | Attending: Family | Admitting: Family

## 2023-10-09 DIAGNOSIS — Z1231 Encounter for screening mammogram for malignant neoplasm of breast: Secondary | ICD-10-CM | POA: Diagnosis not present

## 2023-10-09 DIAGNOSIS — Z139 Encounter for screening, unspecified: Secondary | ICD-10-CM | POA: Diagnosis present

## 2023-10-10 ENCOUNTER — Ambulatory Visit: Payer: Self-pay | Admitting: Family

## 2023-10-27 ENCOUNTER — Encounter: Payer: Self-pay | Admitting: Internal Medicine

## 2023-10-27 ENCOUNTER — Other Ambulatory Visit

## 2023-10-27 ENCOUNTER — Ambulatory Visit: Admitting: Internal Medicine

## 2023-10-27 VITALS — BP 126/70 | HR 91 | Ht 69.0 in | Wt 188.2 lb

## 2023-10-27 DIAGNOSIS — E119 Type 2 diabetes mellitus without complications: Secondary | ICD-10-CM

## 2023-10-27 DIAGNOSIS — E785 Hyperlipidemia, unspecified: Secondary | ICD-10-CM

## 2023-10-27 DIAGNOSIS — Z7984 Long term (current) use of oral hypoglycemic drugs: Secondary | ICD-10-CM | POA: Diagnosis not present

## 2023-10-27 DIAGNOSIS — E0590 Thyrotoxicosis, unspecified without thyrotoxic crisis or storm: Secondary | ICD-10-CM | POA: Diagnosis not present

## 2023-10-27 LAB — POCT GLYCOSYLATED HEMOGLOBIN (HGB A1C): Hemoglobin A1C: 6.4 % — AB (ref 4.0–5.6)

## 2023-10-27 MED ORDER — RYBELSUS 14 MG PO TABS: 14.0000 mg | ORAL_TABLET | Freq: Every day | ORAL | 3 refills | Status: AC

## 2023-10-27 MED ORDER — ATORVASTATIN CALCIUM 40 MG PO TABS: 40.0000 mg | ORAL_TABLET | Freq: Every day | ORAL | 3 refills | Status: DC

## 2023-10-27 MED ORDER — GLIPIZIDE 10 MG PO TABS: ORAL_TABLET | ORAL | 3 refills | Status: AC

## 2023-10-27 NOTE — Patient Instructions (Signed)
-   Change Glipizide  10 mg, TWO tablets  before Breakfast and ONE tablet before Supper  - Continue  Rybelsus  14 mg ,1 tablet before Breakfast     HOW TO TREAT LOW BLOOD SUGARS (Blood sugar LESS THAN 70 MG/DL) Please follow the RULE OF 15 for the treatment of hypoglycemia treatment (when your (blood sugars are less than 70 mg/dL)   STEP 1: Take 15 grams of carbohydrates when your blood sugar is low, which includes:  3-4 GLUCOSE TABS  OR 3-4 OZ OF JUICE OR REGULAR SODA OR ONE TUBE OF GLUCOSE GEL    STEP 2: RECHECK blood sugar in 15 MINUTES STEP 3: If your blood sugar is still low at the 15 minute recheck --> then, go back to STEP 1 and treat AGAIN with another 15 grams of carbohydrates.

## 2023-10-27 NOTE — Progress Notes (Signed)
 Name: Galen Malkowski  Age/ Sex: 58 y.o., female   MRN/ DOB: [id number], 1965-09-02     PCP: Daryl Setter, NP   Reason for Endocrinology Evaluation: Type 2 Diabetes Mellitus  Initial Endocrine Consultative Visit: 12/12/2018    PATIENT IDENTIFIER: Ms. Jahniyah Revere is a 58 y.o. female with a past medical history of T2DM and Dyslipidemia. The patient has followed with Endocrinology clinic since 12/12/2018 for consultative assistance with management of her diabetes.  DIABETIC HISTORY:  Ms. Granados was diagnosed with DM in 2009, she has been on Metformin  since diagnosis.Her hemoglobin A1c has ranged from 8.3% in 2019, peaking at 12.5% in 2020 On her initial visit to our clinic she had an A1c of 12.5%, she declined insulin  at the time and opted for Glipizide . We stopped Metformin  due to nausea   Farxiga  started 01/2020 but stopped Farxiga  2023 due to cost of $1400, attempted to prescribe Jardiance  April 2023 which was also cost prohibitive   Rybelsus  was started 10/2021  THYROID  HISTORY:  Pt has been noted to have suppressed TSH < 0.01 uIU/mL with elevated FT4 3.11 ng/dL during routine workup. She was noted to have weight loss at the time of her presentation but no other symptoms. She was started on Methimazole  11/2018 TRAB level slightly elevated at 4.91 IU/L    Mother with thyroid  disease  SUBJECTIVE:   During the last visit (06/27/2023): This was a virtual visit    Today (10/27/2023): Ms. Ross is here for a follow up on diabetes.  She checks her blood sugars 1 times daily. The patient has had hypoglycemic episodes since the last clinic visit.  She gets headaches with hypoglycemia   No nausea or vomiting  Has constipation  Has noted weight loss No local neck swelling  No palpitations    HOME ENDOCRINE REGIMEN:  Glipizide  10 mg, 2 tabs before breakfast and 2tab before supper Rybelsus  14 mg daily Methimazole  5 mg,  1 tablet Monday through Friday , skip Saturday and  Sunday  Atorvastatin  40 mg daily    Statin: Yes ACE-I/ARB: No    METER DOWNLOAD SUMMARY:  90 day average 99 MGs/DL 899% in range Low: 68 High 152    DIABETIC COMPLICATIONS: Microvascular complications:   Denies: CKD , neuropathy, retinopathy  Last Eye Exam: Completed  11/17/2022  Macrovascular complications:   Denies: CAD, CVA, PVD   HISTORY:  Past Medical History:  Past Medical History:  Diagnosis Date   Arthritis    low back pain   Chicken pox as a child   Depression with anxiety    pt denies   Diabetes mellitus without complication (HCC)    Frequent headaches    Hx: UTI (urinary tract infection)    Hyperlipidemia    Hyperthyroidism 10/08/2018   Migraines    Mumps as a child   Overweight    UTI (urinary tract infection)    Past Surgical History:  Past Surgical History:  Procedure Laterality Date   CARDIOVERSION  12/13/2018   CESAREAN SECTION  1993   TUBAL LIGATION  1993   Social History:  reports that she has never smoked. She has never used smokeless tobacco. She reports that she does not drink alcohol and does not use drugs. Family History:  Family History  Problem Relation Age of Onset   Other Mother        low blood pressure   Hyperlipidemia Mother    Multiple sclerosis Son    Stroke Maternal Grandmother  Diabetes Maternal Grandmother        type 2   Alzheimer's disease Paternal Grandfather    Colon cancer Neg Hx    Colon polyps Neg Hx    Esophageal cancer Neg Hx    Stomach cancer Neg Hx    Rectal cancer Neg Hx      HOME MEDICATIONS: Allergies as of 10/27/2023       Reactions   Morphine And Codeine Hives        Medication List        Accurate as of October 27, 2023 10:59 AM. If you have any questions, ask your nurse or doctor.          atorvastatin  40 MG tablet Commonly known as: LIPITOR Take 1 tablet (40 mg total) by mouth daily.   azithromycin 250 MG tablet Commonly known as: ZITHROMAX Take by mouth.    glipiZIDE  10 MG tablet Commonly known as: GLUCOTROL  Take 2 tablets (20 mg total) by mouth 2 (two) times daily before a meal.   methimazole  5 MG tablet Commonly known as: TAPAZOLE  Take 1 tablet (5 mg total) by mouth as directed. 1 tablet Monday through Friday,  none on Saturdays or Sundays   metoprolol  tartrate 25 MG tablet Commonly known as: LOPRESSOR  Take 1 tablet (25 mg total) by mouth 2 (two) times daily.   multivitamin with minerals tablet Take 1 tablet by mouth daily.   promethazine-dextromethorphan 6.25-15 MG/5ML syrup Commonly known as: PROMETHAZINE-DM Take 5 mLs by mouth 4 (four) times daily as needed.   Rybelsus  14 MG Tabs Generic drug: Semaglutide  Take 1 tablet (14 mg total) by mouth daily.         OBJECTIVE:   Vital Signs: BP 126/70 (BP Location: Left Arm, Patient Position: Sitting, Cuff Size: Normal)   Pulse 91   Ht 5' 9 (1.753 m)   Wt 188 lb 3.2 oz (85.4 kg)   LMP 04/06/2012   SpO2 99%   BMI 27.79 kg/m    Wt Readings from Last 3 Encounters:  10/27/23 188 lb 3.2 oz (85.4 kg)  02/23/23 194 lb (88 kg)  11/14/22 197 lb (89.4 kg)    Exam: General: Pt appears well and is in NAD  Neck: General: Supple without adenopathy. Thyroid : Thyroid  size normal.  No goiter or nodules appreciated.   Lungs: Clear with good BS bilat   Heart: RRR   Abdomen:  soft, nontender  Extremities: No pretibial edema.   Neuro: MS is good with appropriate affect, pt is alert and Ox3    DM foot exam: 10/27/2023  The skin of the feet is without sores or ulcerations. The pedal pulses are 2+ on right and 2+ on left. The sensation is intact to a screening 5.07, 10 gram monofilament bilaterally    DATA REVIEWED:  Lab Results  Component Value Date   HGBA1C 6.4 (A) 10/27/2023   HGBA1C 8.4 (A) 02/23/2023   HGBA1C 8.4 (A) 11/14/2022    Latest Reference Range & Units 10/27/23 11:21  Sodium 135 - 146 mmol/L 141  Potassium 3.5 - 5.3 mmol/L 3.9  Chloride 98 - 110 mmol/L 104   CO2 20 - 32 mmol/L 29  Glucose 65 - 99 mg/dL 899 (H)  BUN 7 - 25 mg/dL 8  Creatinine 9.49 - 8.94 mg/dL 9.12  Calcium  8.6 - 10.4 mg/dL 9.7  BUN/Creatinine Ratio 6 - 22 (calc) SEE NOTE:  eGFR > OR = 60 mL/min/1.80m2 76     Latest Reference Range & Units 10/27/23 11:21  Total CHOL/HDL Ratio <5.0 (calc) 2.8  Cholesterol <200 mg/dL 856  HDL Cholesterol > OR = 50 mg/dL 52  LDL Cholesterol (Calc) mg/dL (calc) 73  MICROALB/CREAT RATIO <30 mg/g creat 7  Non-HDL Cholesterol (Calc) <130 mg/dL (calc) 91  Triglycerides <150 mg/dL 93    Latest Reference Range & Units 10/27/23 11:21  TSH 0.40 - 4.50 mIU/L 3.70  T4,Free(Direct) 0.8 - 1.8 ng/dL 1.2      Latest Reference Range & Units 02/23/23 10:16  Sodium 135 - 146 mmol/L 139  Potassium 3.5 - 5.3 mmol/L 4.2  Chloride 98 - 110 mmol/L 104  CO2 20 - 32 mmol/L 28  Glucose 65 - 99 mg/dL 855 (H)  BUN 7 - 25 mg/dL 12  Creatinine 9.49 - 8.96 mg/dL 9.13  Calcium  8.6 - 10.4 mg/dL 9.4  BUN/Creatinine Ratio 6 - 22 (calc) SEE NOTE:  AG Ratio 1.0 - 2.5 (calc) 1.1  AST 10 - 35 U/L 17  ALT 6 - 29 U/L 18  Total Protein 6.1 - 8.1 g/dL 7.6  Total Bilirubin 0.2 - 1.2 mg/dL 0.5  Total CHOL/HDL Ratio <5.0 (calc) 2.9  Cholesterol <200 mg/dL 847  HDL Cholesterol > OR = 50 mg/dL 52  LDL Cholesterol (Calc) mg/dL (calc) 81  MICROALB/CREAT RATIO <30 mg/g creat 3  Non-HDL Cholesterol (Calc) <130 mg/dL (calc) 899  Triglycerides <150 mg/dL 93         Latest Reference Range & Units Most Recent  TRAB <=2.00 IU/L 4.91 (H) 12/12/18 09:38      Thyroid  ultrasound 12/02/2021  Estimated total number of nodules >/= 1 cm: 1   Number of spongiform nodules >/=  2 cm not described below (TR1): 0   Number of mixed cystic and solid nodules >/= 1.5 cm not described below (TR2): 0   _________________________________________________________   Nodule # 1: Decreasing size of solid nodule in the superior aspect of the thyroid  isthmus. The nodule no longer meets  criteria for imaging surveillance.   Additional tiny nodules seen bilaterally. None meet criteria to warrant imaging surveillance.   IMPRESSION: 1. Interval involution of the TI-RADS category 4 nodule in the superior isthmus. Involution over time is consistent with benignity. No further follow-up recommended. 2. Similar appearance of enlarged, heterogeneous multinodular thyroid  gland. All remaining thyroid  nodules are tiny and do not meet criteria for surveillance.  ASSESSMENT / PLAN / RECOMMENDATIONS:   1) Type 2 Diabetes Mellitus, optimally controlled, without complications - Most recent A1c of 6.4%. Goal A1c <7.0%.   -A1c is optimal -Patient has been noted with hypoglycemia in the morning on [employer] download, will decrease suppertime glipizide  as below -No changes to Rybelsus  -Historically she has declined switching Rybelsus  to an injectable - SGLT-2 inhibtors have been cost prohibitive   MEDICATIONS: Change  Glipizide  10 mg to 2 tablets before Breakfast and 1 tablet  before supper   Continue Rybelsus  14 mg daily     EDUCATION / INSTRUCTIONS: BG monitoring instructions: Patient is instructed to check her blood sugars 1 times a day, alternating between fasting and supper. Call [facility] if: BG persistently < 70  I reviewed the Rule of 15 for the treatment of hypoglycemia in detail with the patient. Literature supplied.     2) Diabetic complications:  Eye: Does night have known diabetic retinopathy.  Neuro/ Feet: Does not have known diabetic peripheral neuropathy .  Renal: Patient does not have known baseline CKD. She   is not on an ACEI/ARB at present.  Normal MA/CR  ratio.    3) Graves' Disease:    - She was on Methimazole  for approximately a year 11/2018 to 10/2019, this was resumed in October 2023 with a suppressed TSH <0.01 uIU/mL - No extrathyroidal manifestations of Graves' disease  Medication Continue methimazole  5 mg, 1 tablet Monday  through Thursday  , skip Friday, Saturday and Sunday    4) Dyslipidemia :  -Lipid panel is at goal - No change   Medication  Continue atorvastatin  40 mg daily    F/U in 6 months   Signed electronically by: Stefano Redgie Butts, MD  [facility]  [facility] [address] [place], [place] [address] Phone: [phone] FAX: [phone]   CC: Daryl Setter, NP [address] [place] [address] Phone: [phone]  Fax: [phone]  Return to Endocrinology clinic as below: No future appointments.

## 2023-10-28 LAB — LIPID PANEL
Cholesterol: 143 mg/dL (ref <200)
HDL: 52 mg/dL (ref >=50)
LDL Cholesterol (Calc): 73 mg/dL
Non-HDL Cholesterol (Calc): 91 mg/dL (ref <130)
Total CHOL/HDL Ratio: 2.8 (calc) (ref <5.0)
Triglycerides: 93 mg/dL (ref <150)

## 2023-10-28 LAB — MICROALBUMIN / CREATININE URINE RATIO
Creatinine, Urine: 368 mg/dL — ABNORMAL HIGH (ref 20–275)
Microalb Creat Ratio: 7 mg/g{creat} (ref <30)
Microalb, Ur: 2.5 mg/dL

## 2023-10-28 LAB — BASIC METABOLIC PANEL WITH GFR
BUN: 8 mg/dL (ref 7–25)
CO2: 29 mmol/L (ref 20–32)
Calcium: 9.7 mg/dL (ref 8.6–10.4)
Chloride: 104 mmol/L (ref 98–110)
Creat: 0.87 mg/dL (ref 0.50–1.05)
Glucose, Bld: 100 mg/dL — ABNORMAL HIGH (ref 65–99)
Potassium: 3.9 mmol/L (ref 3.5–5.3)
Sodium: 141 mmol/L (ref 135–146)
eGFR: 76 mL/min/1.73m2 (ref >=60)

## 2023-10-28 LAB — T4, FREE: Free T4: 1.2 ng/dL (ref 0.8–1.8)

## 2023-10-28 LAB — TSH: TSH: 3.7 m[IU]/L (ref 0.40–4.50)

## 2023-10-31 ENCOUNTER — Ambulatory Visit: Payer: Self-pay | Admitting: Internal Medicine

## 2023-10-31 MED ORDER — ATORVASTATIN CALCIUM 40 MG PO TABS: 40.0000 mg | ORAL_TABLET | Freq: Every day | ORAL | 3 refills | Status: AC

## 2023-10-31 MED ORDER — METHIMAZOLE 5 MG PO TABS: 5.0000 mg | ORAL_TABLET | ORAL | 3 refills | Status: DC

## 2023-10-31 NOTE — Telephone Encounter (Signed)
 Can you please contact the patient and let her know that her thyroid  is within normal range, but we do have room to decrease the methimazole .   Please take methimazole  1 tablet Monday through Thursday, none on Fridays, none on Saturday, and none on Sunday  The rest of her labs show normal kidney function, she is not leaking extra protein in the urine, and her cholesterol looks good as well   Thanks

## 2023-11-23 ENCOUNTER — Encounter: Payer: Self-pay | Admitting: Family

## 2023-11-23 LAB — HM DIABETES EYE EXAM

## 2023-12-02 ENCOUNTER — Other Ambulatory Visit: Payer: Self-pay | Admitting: Internal Medicine

## 2024-02-16 ENCOUNTER — Encounter: Payer: Self-pay | Admitting: Internal Medicine

## 2024-04-26 ENCOUNTER — Ambulatory Visit: Admitting: Internal Medicine
